# Patient Record
Sex: Female | Born: 1960 | Race: White | Hispanic: No | Marital: Married | Smoking: Never smoker
Health system: Southern US, Community
[De-identification: ages and names within clinical notes are randomized; demographics above are authoritative.]

## PROBLEM LIST (undated history)

## (undated) DIAGNOSIS — Z8719 Personal history of other diseases of the digestive system: Secondary | ICD-10-CM

## (undated) DIAGNOSIS — N200 Calculus of kidney: Secondary | ICD-10-CM

## (undated) DIAGNOSIS — E079 Disorder of thyroid, unspecified: Secondary | ICD-10-CM

## (undated) DIAGNOSIS — K219 Gastro-esophageal reflux disease without esophagitis: Secondary | ICD-10-CM

## (undated) HISTORY — DX: Disorder of thyroid, unspecified: E07.9

## (undated) HISTORY — DX: Calculus of kidney: N20.0

## (undated) HISTORY — DX: Gastro-esophageal reflux disease without esophagitis: K21.9

## (undated) HISTORY — DX: Personal history of other diseases of the digestive system: Z87.19

---

## 1998-04-22 HISTORY — PX: ESOPHAGOGASTRODUODENOSCOPY: SHX1529

## 2004-07-10 ENCOUNTER — Ambulatory Visit: Payer: Self-pay | Admitting: Obstetrics and Gynecology

## 2006-03-12 ENCOUNTER — Ambulatory Visit: Payer: Self-pay | Admitting: Internal Medicine

## 2006-03-31 ENCOUNTER — Ambulatory Visit: Payer: Self-pay | Admitting: Internal Medicine

## 2006-04-29 ENCOUNTER — Ambulatory Visit: Payer: Self-pay | Admitting: Internal Medicine

## 2006-06-11 ENCOUNTER — Ambulatory Visit: Payer: Self-pay | Admitting: Obstetrics and Gynecology

## 2006-11-03 ENCOUNTER — Ambulatory Visit: Payer: Self-pay | Admitting: Internal Medicine

## 2006-12-02 ENCOUNTER — Ambulatory Visit: Payer: Self-pay | Admitting: Internal Medicine

## 2006-12-02 LAB — CONVERTED CEMR LAB
BUN: 8 mg/dL (ref 6–23)
Basophils Relative: 3.8 % — ABNORMAL HIGH (ref 0.0–1.0)
CO2: 27 meq/L (ref 19–32)
Calcium: 8.7 mg/dL (ref 8.4–10.5)
Creatinine, Ser: 0.6 mg/dL (ref 0.4–1.2)
GFR calc Af Amer: 140 mL/min
H Pylori IgG: NEGATIVE
HDL: 56 mg/dL (ref >39.0)
LDL Cholesterol: 101 mg/dL — ABNORMAL HIGH (ref 0–99)
Monocytes Relative: 6.7 % (ref 3.0–11.0)
Platelets: 259 10*3/uL (ref 150–400)
RDW: 16.3 % — ABNORMAL HIGH (ref 11.5–14.6)
TSH: 1.97 microintl units/mL (ref 0.35–5.50)
Total CHOL/HDL Ratio: 2.9
Triglycerides: 31 mg/dL (ref 0–149)
VLDL: 6 mg/dL (ref 0–40)

## 2006-12-16 ENCOUNTER — Ambulatory Visit: Payer: Self-pay | Admitting: Gastroenterology

## 2007-01-04 ENCOUNTER — Ambulatory Visit: Payer: Self-pay | Admitting: Gastroenterology

## 2007-06-21 ENCOUNTER — Ambulatory Visit: Payer: Self-pay | Admitting: Obstetrics and Gynecology

## 2008-07-19 ENCOUNTER — Ambulatory Visit: Payer: Self-pay | Admitting: Obstetrics and Gynecology

## 2008-09-12 ENCOUNTER — Ambulatory Visit: Payer: Self-pay | Admitting: Family Medicine

## 2009-05-23 ENCOUNTER — Ambulatory Visit: Payer: Self-pay | Admitting: Family Medicine

## 2009-05-23 DIAGNOSIS — R002 Palpitations: Secondary | ICD-10-CM | POA: Insufficient documentation

## 2009-05-23 DIAGNOSIS — R030 Elevated blood-pressure reading, without diagnosis of hypertension: Secondary | ICD-10-CM

## 2010-01-17 ENCOUNTER — Ambulatory Visit: Payer: Self-pay | Admitting: Internal Medicine

## 2010-01-17 DIAGNOSIS — K219 Gastro-esophageal reflux disease without esophagitis: Secondary | ICD-10-CM

## 2010-01-17 DIAGNOSIS — L299 Pruritus, unspecified: Secondary | ICD-10-CM | POA: Insufficient documentation

## 2010-01-21 LAB — CONVERTED CEMR LAB
ALT: 10 units/L (ref 0–35)
AST: 20 units/L (ref 0–37)
Alkaline Phosphatase: 73 units/L (ref 39–117)
Basophils Absolute: 0 10*3/uL (ref 0.0–0.1)
Bilirubin, Direct: 0.1 mg/dL (ref 0.0–0.3)
CO2: 29 meq/L (ref 19–32)
Creatinine, Ser: 0.7 mg/dL (ref 0.4–1.2)
Eosinophils Relative: 1.2 % (ref 0.0–5.0)
GFR calc non Af Amer: 95.59 mL/min (ref >60)
HCT: 34 % — ABNORMAL LOW (ref 36.0–46.0)
Lymphocytes Relative: 15.9 % (ref 12.0–46.0)
Lymphs Abs: 1.1 10*3/uL (ref 0.7–4.0)
Monocytes Relative: 6.8 % (ref 3.0–12.0)
Neutrophils Relative %: 75.7 % (ref 43.0–77.0)
Phosphorus: 3.7 mg/dL (ref 2.3–4.6)
Platelets: 239 10*3/uL (ref 150.0–400.0)
Sodium: 141 meq/L (ref 135–145)
Total Bilirubin: 0.3 mg/dL (ref 0.3–1.2)
WBC: 6.9 10*3/uL (ref 4.5–10.5)

## 2010-02-21 ENCOUNTER — Telehealth: Payer: Self-pay | Admitting: Family Medicine

## 2010-02-21 ENCOUNTER — Emergency Department: Payer: Self-pay | Admitting: Emergency Medicine

## 2010-04-05 ENCOUNTER — Ambulatory Visit: Payer: Self-pay | Admitting: Family Medicine

## 2010-04-05 DIAGNOSIS — D649 Anemia, unspecified: Secondary | ICD-10-CM

## 2010-04-05 DIAGNOSIS — R0602 Shortness of breath: Secondary | ICD-10-CM

## 2010-04-05 DIAGNOSIS — R1013 Epigastric pain: Secondary | ICD-10-CM

## 2010-04-09 LAB — CONVERTED CEMR LAB
Basophils Absolute: 0 10*3/uL (ref 0.0–0.1)
Basophils Relative: 0.4 % (ref 0.0–3.0)
Eosinophils Absolute: 0 10*3/uL (ref 0.0–0.7)
HCT: 34 % — ABNORMAL LOW (ref 36.0–46.0)
Hemoglobin: 11.6 g/dL — ABNORMAL LOW (ref 12.0–15.0)
Lymphs Abs: 0.9 10*3/uL (ref 0.7–4.0)
MCHC: 34.2 g/dL (ref 30.0–36.0)
MCV: 90.9 fL (ref 78.0–100.0)
Neutro Abs: 5.1 10*3/uL (ref 1.4–7.7)
RBC: 3.74 M/uL — ABNORMAL LOW (ref 3.87–5.11)
RDW: 14.2 % (ref 11.5–14.6)

## 2010-04-24 ENCOUNTER — Encounter: Payer: Self-pay | Admitting: Internal Medicine

## 2010-04-25 ENCOUNTER — Ambulatory Visit: Payer: Self-pay | Admitting: Gastroenterology

## 2010-05-20 ENCOUNTER — Ambulatory Visit: Payer: Self-pay | Admitting: Internal Medicine

## 2010-05-20 DIAGNOSIS — IMO0001 Reserved for inherently not codable concepts without codable children: Secondary | ICD-10-CM

## 2010-05-22 ENCOUNTER — Ambulatory Visit: Payer: Self-pay | Admitting: Internal Medicine

## 2010-05-24 LAB — CONVERTED CEMR LAB
Albumin: 4.1 g/dL (ref 3.5–5.2)
BUN: 11 mg/dL (ref 6–23)
Calcium: 9.1 mg/dL (ref 8.4–10.5)
Glucose, Bld: 95 mg/dL (ref 70–99)
Phosphorus: 3.1 mg/dL (ref 2.3–4.6)
Potassium: 4.6 meq/L (ref 3.5–5.1)
Sed Rate: 10 mm/hr (ref 0–22)

## 2010-05-28 ENCOUNTER — Telehealth: Payer: Self-pay | Admitting: Internal Medicine

## 2010-05-31 ENCOUNTER — Ambulatory Visit: Payer: Self-pay | Admitting: Internal Medicine

## 2010-05-31 DIAGNOSIS — R4789 Other speech disturbances: Secondary | ICD-10-CM | POA: Insufficient documentation

## 2010-06-03 ENCOUNTER — Ambulatory Visit: Payer: Self-pay | Admitting: Otolaryngology

## 2010-06-03 ENCOUNTER — Encounter: Payer: Self-pay | Admitting: Internal Medicine

## 2010-10-23 ENCOUNTER — Ambulatory Visit: Payer: Self-pay | Admitting: Obstetrics and Gynecology

## 2010-10-23 NOTE — Progress Notes (Signed)
Summary: Trouble with speech  Phone Note Call from Patient Call back at Home Phone [phone]   Caller: Patient Call For: Cindee Salt MD Summary of Call: Patient called for her lab results. Results given to patient and informed patient that they were also put on phone tree for her.  Patient is concerned about her muscle cramps.  Patient is concerned about her speech because she is having trouble with her speech at times.  Patient is aware that Dr. Alphonsus Sias is out of the office this afternoon. Initial call taken by: Sydell Axon LPN,  May 28, 2010 4:48 PM  Follow-up for Phone Call        message left Cindee Salt MD  May 29, 2010 8:57 AM  Left message again Probably in classrooom Please try her later this afternoon to get more specific information Can offer Friday afternoon visit I can try to call late tomorrow afternoon if that is more appropriate Cindee Salt MD  May 29, 2010 1:58 PM   left message on machine at home for patient to return my call.  DeShannon Smith CMA Duncan Dull)  May 29, 2010 4:07 PM   left message on machine at home for patient to return my call.  DeShannon Katrinka Blazing CMA Duncan Dull)  May 30, 2010 5:37 PM   Additional Follow-up for Phone Call Additional follow up Details #1::        Appt scheduled for today at 3:45. Additional Follow-up by: Melody Comas,  May 31, 2010 2:06 PM

## 2010-10-23 NOTE — Assessment & Plan Note (Signed)
Summary: problems w/ speech/alc   Vital Signs:  Patient profile:   50 year old female Weight:      147 pounds Temp:     98.1 degrees F oral BP sitting:   138 / 70  (left arm) Cuff size:   regular  Vitals Entered By: Mervin Hack CMA Duncan Dull) (May 31, 2010 4:00 PM) CC: PROBLEMS WITH SPEECH   History of Present Illness: For the past week, she feels she is messing up words Mixes up consonants--switching them Has been happening multiple times daily  Has just started new job teaching Does have some stress but not overly so  Has to make special effort to try to speak properly clearly abnormal for her No special problem with word finding  Did have upper GI test due to dysfunction of epiglottis? did show some reflux  No swallowing problems Muscle pain and tingling is better No focal weakness  Allergies: No Known Drug Allergies  Past History:  Past medical, surgical, family and social histories (including risk factors) reviewed for relevance to current acute and chronic problems.  Past Medical History: Reviewed history from 01/17/2010 and no changes required. GERD  Past Surgical History: Reviewed history from 01/17/2010 and no changes required. VD x 4 8/99   EGD-normal 4/08 EGD--reflux esophagitis, few angioectasias  Family History: Reviewed history from 01/17/2010 and no changes required. Mom died from leukemia @75  Dad alive 7 sibs No CAD, DM, HTN  Social History: Reviewed history from 01/17/2010 and no changes required. Occupation:  [employer] Married--2nd 4 children Never Smoked Alcohol use-no  Review of Systems       Sleeping fair--does have night awakening and may have trouble getting back to sleep feels like she is perimenopausal did have 2 MRIs in past 16 years due to various concerns  Physical Exam  General:  alert and normal appearance.   Eyes:  pupils equal, pupils round, pupils reactive to light, and no optic disk  abnormalities.   Mouth:  no erythema and no lesions.   Neck:  supple and no masses.   Neurologic:  alert & oriented X3, cranial nerves II-XII intact, strength normal in all extremities, gait normal, finger-to-nose normal, heel-to-shin normal, and Romberg negative.   Psych:  normally interactive, good eye contact, not depressed appearing, and slightly anxious.     Impression & Recommendations:  Problem # 1:  OTHER SPEECH DISTURBANCE (ICD-784.59) Assessment New mild but definitely not normal could be related to stress but need to consider CNS etiology If something there, would probably be in left cortex in language area (as she is right handed) The epiglottis issues she has would not be cortical but probably brain stem  ENT has already ordered MRI with contrast this is appropriate to exclude cerebrovascular disease (unlikely) or MS (very unlikely)  Complete Medication List: 1)  Prilosec 40 Mg Cpdr (Omeprazole) .Marland Kitchen.. 1 tab by mouth daily 2)  Metoprolol Succinate 100 Mg Xr24h-tab (Metoprolol succinate) .... Take 1/2 tab daily  Patient Instructions: 1)  Please keep regular follow up  Current Allergies (reviewed today): No known allergies

## 2010-10-23 NOTE — Letter (Signed)
Summary: [facility]  [facility]   Imported By: Lanelle Bal 07/17/2010 13:37:22  _____________________________________________________________________  External Attachment:    Type:   Image     Comment:   External Document  Appended Document: [facility] GERD with globus sensation improved on PPI had barium swallow done

## 2010-10-23 NOTE — Progress Notes (Signed)
Summary: Palpitations  Phone Note Call from Patient Call back at Home Phone [phone]   Caller: Patient Call For: Cindee Salt MD Summary of Call: Patient says she has had palpitations in the past and had an EKG and was told that things were okay.  She has had more palpitations in the past few months but yesterday and today have been pretty frequent and the past 2 hours has been almost constant to the point that she is getting anxious.  No appointments available with anyone.  Please advise. Initial call taken by: Delilah Shan CMA (AAMA),  February 21, 2010 12:58 PM  Follow-up for Phone Call        If that bad, to ER to be seen. Follow-up by: Shaune Leeks MD,  February 21, 2010 1:35 PM  Additional Follow-up for Phone Call Additional follow up Details #1::        Left message on voicemail  in detail.  Personalized VM.   Lugene Fuquay CMA (AAMA)  February 21, 2010 2:06 PM   Pt called to report that she did go to ER yesterday and will be seeing a cardiologit. Additional Follow-up by: Lowella Petties CMA,  February 22, 2010 9:43 AM    Additional Follow-up for Phone Call Additional follow up Details #2::    Noted. Follow-up by: Shaune Leeks MD,  February 22, 2010 1:54 PM

## 2010-10-23 NOTE — Assessment & Plan Note (Signed)
Summary: itching and talk about blood work/dlo   Vital Signs:  Patient profile:   50 year old female Weight:      146 pounds Temp:     98.2 degrees F oral Pulse rate:   92 / minute Pulse rhythm:   regular BP sitting:   150 / 80  (left arm) Cuff size:   regular  Vitals Entered By: Mervin Hack CMA Duncan Dull) (January 17, 2010 4:23 PM) CC: itching all over   History of Present Illness: Sees gynecologist every year  Has had some itching for the past month prominently---may have started mildly before that No dry skin doesn't seem to be pollen related no rash Tried benedryl--has helped at bedtime some May get rash after itching  Does tend to think about things---read about how itching can be due to blood cancers No generalized anxiety problems Occ pinprick type feeling  Varies soap and detergent but doesn't seem related On iron but no change in that for a couple of years  Preventive Screening-Counseling & Management  Alcohol-Tobacco     Smoking Status: never  Allergies: No Known Drug Allergies  Past History:  Past Medical History: GERD  Past Surgical History: VD x 4 8/99   EGD-normal 4/08 EGD--reflux esophagitis, few angioectasias  Family History: Mom died from leukemia @75  Dad alive 7 sibs No CAD, DM, HTN  Social History: Occupation:  [employer] Married--2nd 4 children Never Smoked Alcohol use-no Occupation:  employed Smoking Status:  never  Review of Systems       weight stable sleeps okay--occ night awakening Occ depressed mood---generally resolves within 1-2 days  Physical Exam  General:  alert and normal appearance.   Mouth:  no erythema and no exudates.   Neck:  supple, no masses, no thyromegaly, no carotid bruits, and no cervical lymphadenopathy.   Lungs:  normal respiratory effort and normal breath sounds.   Heart:  normal rate, regular rhythm, no murmur, and no gallop.   Abdomen:  soft, non-tender, no hepatomegaly, and  no splenomegaly.   Msk:  no joint tenderness and no joint swelling.   Pulses:  normal in feet Extremities:  no edema Axillary Nodes:  No palpable lymphadenopathy Inguinal Nodes:  No significant adenopathy Psych:  normally interactive, good eye contact, not anxious appearing, and not depressed appearing.     Impression & Recommendations:  Problem # 1:  PRURITUS (ICD-698.9) Assessment New  sounds like she may have some dermatographism also no true hives no rash  discussed moisturizing soaps and cream after shower benedryl at night will check labs  Orders: Venipuncture (16109) TLB-Renal Function Panel (80069-RENAL) TLB-CBC Platelet - w/Differential (85025-CBCD) TLB-Hepatic/Liver Function Pnl (80076-HEPATIC) TLB-TSH (Thyroid Stimulating Hormone) (84443-TSH) TLB-B12, Serum-Total ONLY (60454-U98) TLB-Sedimentation Rate (ESR) (85652-ESR)  Complete Medication List: 1)  Hydroxyzine Pamoate 25 Mg Caps (Hydroxyzine pamoate) .Marland Kitchen.. 1 tab by mouth three times a day as needed for itching  Patient Instructions: 1)  Please set up a physical at your convenience Prescriptions: HYDROXYZINE PAMOATE 25 MG CAPS (HYDROXYZINE PAMOATE) 1 tab by mouth three times a day as needed for itching  #90 x 1   Entered and Authorized by:   Cindee Salt MD   Signed by:   Cindee Salt MD on 01/17/2010   Method used:   Electronically to        [facility] (retail)       [address]       [place]  [place], [place]  [address]       Ph: [phone]       Fax: [phone]   RxID:   [id number]   Prior Medications: Current Allergies (reviewed today): No known allergies

## 2010-10-23 NOTE — Assessment & Plan Note (Signed)
Summary: BODY ACHES & CRAMPING/CLE   Vital Signs:  Patient profile:   50 year old female Weight:      148 pounds Pulse rate:   88 / minute Pulse rhythm:   regular BP sitting:   132 / 70  (left arm) Cuff size:   regular  Vitals Entered By: Mervin Hack CMA Duncan Dull) (May 20, 2010 5:03 PM) CC: muscle pain, body aches and all over body cramping   History of Present Illness: Having a lot of muscle cramps over the past 2 weeks Occ just feels it in her arms, occ all over Feels "knots" in her arms--makes her on edge tries to rub it to relieve them but not helpful  More at night but during the day at times also Last night was bad Occ shooting pains and tingling  started after moving things at work all day  No fever No recent illness Has had mosquito bites regularly  Appetite is okay no weight loss  Home BP 90-100 systolic and pulse  ~70  Allergies: No Known Drug Allergies  Past History:  Past medical, surgical, family and social histories (including risk factors) reviewed for relevance to current acute and chronic problems.  Past Medical History: Reviewed history from 01/17/2010 and no changes required. GERD  Past Surgical History: Reviewed history from 01/17/2010 and no changes required. VD x 4 8/99   EGD-normal 4/08 EGD--reflux esophagitis, few angioectasias  Family History: Reviewed history from 01/17/2010 and no changes required. Mom died from leukemia @75  Dad alive 7 sibs No CAD, DM, HTN  Social History: Reviewed history from 01/17/2010 and no changes required. Occupation:  [employer] Married--2nd 4 children Never Smoked Alcohol use-no  Review of Systems       Palpitations in June---wound up seeing cardiologist. Started on metoprolol for her pulse being up She feels this may be a nerve thing with going to the doctor has been on 200mg  but now decreased to 100 (did help the palpitations) Stomach trouble while on the  metoprolol---now better omeprazole with some help. Nexium didn't help Has had barium swallow done and then saw ENT  Physical Exam  General:  alert and normal appearance.   Neck:  supple, no masses, no thyromegaly, and no cervical lymphadenopathy.   Lungs:  normal respiratory effort, no intercostal retractions, no accessory muscle use, and normal breath sounds.   Heart:  normal rate, regular rhythm, no murmur, and no gallop.  HR  ~90 Msk:  no joint tenderness and no joint swelling.   No muscle tenderness Neurologic:  alert & oriented X3, strength normal in all extremities, and gait normal.   Psych:  normally interactive, good eye contact, not depressed appearing, and slightly anxious.     Impression & Recommendations:  Problem # 1:  MYALGIA (ICD-729.1) Assessment New not clear cut situation mostly cramps but some pain will try to wean the metoprolol check labs increase fluids  Complete Medication List: 1)  Prilosec 40 Mg Cpdr (Omeprazole) .Marland Kitchen.. 1 tab by mouth daily 2)  Metoprolol Succinate 100 Mg Xr24h-tab (Metoprolol succinate) .... Take 1/2 tab daily  Patient Instructions: 1)  Please cut back on the metoprolol to 50mg  daily 2)  Increase your fluid intake 3)  Please return for labwork  -- renal, CPK-total, ESR (sed rate)--729.1 4)  Please call for reevaluation in 2-3 weeks if the muscle cramps and pain are not better  Current Allergies (reviewed today): No known allergies

## 2010-10-23 NOTE — Assessment & Plan Note (Signed)
Summary: SHORTNESS OF BREATH X 2 WEEKS   Vital Signs:  Patient profile:   50 year old female Height:      68.5 inches Weight:      147.38 pounds BMI:     22.16 O2 Sat:      94 % on Room air Temp:     97.7 degrees F oral Pulse rate:   84 / minute Pulse rhythm:   regular BP sitting:   110 / 70  (left arm) Cuff size:   regular  Vitals Entered By: Linde Gillis CMA Duncan Dull) (April 05, 2010 12:10 PM)  O2 Flow:  Room air CC: shortness of breath   CC:  shortness of breath.  History of Present Illness: 50 yo new to me here for SOB.  Symptoms have occurred for over five years. No exercise intolerance of DOE.  No CP. Often has a dry cough and feels short of breath. Often associated with epigastric pain. Has had more gas, burping. No n/v/d.  No changes in bowels. No fevers. No wheezing. No LE or fatigue.  Has appt with [facility]) next month for IBS.  Current Medications (verified): 1)  Metoprolol Succinate 200 Mg Xr24h-Tab (Metoprolol Succinate) .... Take One Tablet By Mouth Once Daily 2)  Prilosec 40 Mg Cpdr (Omeprazole) .Marland Kitchen.. 1 Tab By Mouth Daily  Allergies (verified): No Known Drug Allergies  Past History:  Past Medical History: Last updated: 2010-01-19 GERD  Past Surgical History: Last updated: January 19, 2010 VD x 4 8/99   EGD-normal 4/08 EGD--reflux esophagitis, few angioectasias  Family History: Last updated: 01-19-2010 Mom died from leukemia @75  Dad alive 7 sibs No CAD, DM, HTN  Social History: Last updated: 2010-01-19 Occupation:  Teacher--elementary school Married--2nd 4 children Never Smoked Alcohol use-no  Risk Factors: Smoking Status: never (01/19/2010)  Review of Systems      See HPI General:  Denies fever. Resp:  Complains of cough and shortness of breath; denies sputum productive and wheezing. GI:  Complains of abdominal pain, gas, and indigestion; denies change in bowel habits, diarrhea, loss of appetite, nausea, and  vomiting.  Physical Exam  General:  alert and normal appearance.   VSS, O2 sat 94%. Lungs:  normal respiratory effort and normal breath sounds.   Heart:  normal rate, regular rhythm, no murmur, and no gallop.   Abdomen:  soft, non-tender, no hepatomegaly, and no splenomegaly.   Psych:  normally interactive, good eye contact, not anxious appearing, and not depressed appearing.     Impression & Recommendations:  Problem # 1:  SHORTNESS OF BREATH (ICD-786.05) Assessment New Long standing and associated with reflux type symptoms. Likely due to reflux, lungs clear, exercise tolerance not affected and O2 sats normal--unlikely a pulmonary etiology. Will check Lipase although does not have associated symptoms of pancreatitis. Try omeprazole 40 mg daily x 2 weeks. Keep appt with GI.  Complete Medication List: 1)  Metoprolol Succinate 200 Mg Xr24h-tab (Metoprolol succinate) .... Take one tablet by mouth once daily 2)  Prilosec 40 Mg Cpdr (Omeprazole) .Marland Kitchen.. 1 tab by mouth daily  Other Orders: Venipuncture (16109) TLB-CBC Platelet - w/Differential (85025-CBCD) TLB-Lipase (83690-LIPASE)  Current Allergies (reviewed today): No known allergies

## 2010-12-02 ENCOUNTER — Ambulatory Visit: Payer: Self-pay | Admitting: Family Medicine

## 2011-12-15 ENCOUNTER — Encounter: Payer: Self-pay | Admitting: Internal Medicine

## 2011-12-15 ENCOUNTER — Ambulatory Visit (INDEPENDENT_AMBULATORY_CARE_PROVIDER_SITE_OTHER): Payer: BC Managed Care – PPO | Admitting: Internal Medicine

## 2011-12-15 VITALS — BP 130/68 | HR 122 | Temp 98.6°F | Wt 146.0 lb

## 2011-12-15 DIAGNOSIS — J069 Acute upper respiratory infection, unspecified: Secondary | ICD-10-CM | POA: Insufficient documentation

## 2011-12-15 MED ORDER — AMOXICILLIN 500 MG PO TABS: 1000.0000 mg | ORAL_TABLET | Freq: Two times a day (BID) | ORAL | Status: AC

## 2011-12-15 NOTE — Progress Notes (Signed)
  Subjective:    Patient ID: Jenny Rangel, female    DOB: June 29, 1961, 51 y.o.   MRN: [id number]  HPI Has had a cold that started with laryngitis last week Seemed to improve and then worsened again Nighttime sore throat----"throbbing" sensation Congestion in head and sinus Some headache Chills without clear fever  Some cough--then breaks up after morning No SOB Occ referred right ear pain from throat  Tried tylenol and gargling with salt water---?some help  No current outpatient prescriptions on file prior to visit.    No Known Allergies  Past Medical History  Diagnosis Date  . GERD (gastroesophageal reflux disease)     Past Surgical History  Procedure Date  . Vaginal delivery     x4  . Esophagogastroduodenoscopy 08/99    normal    Family History  Problem Relation Age of Onset  . Diabetes Neg Hx   . Heart disease Neg Hx   . Hypertension Neg Hx     History   Social History  . Marital Status: Married    Spouse Name: N/A    Number of Children: 4  . Years of Education: N/A   Occupational History  . teacher, elementary school    Social History Main Topics  . Smoking status: Never Smoker   . Smokeless tobacco: Never Used  . Alcohol Use: No  . Drug Use: No  . Sexually Active: Not on file   Other Topics Concern  . Not on file   Social History Narrative  . No narrative on file     Review of Systems No vomiting or diarrhea Appetite down some    Objective:   Physical Exam  Constitutional: She appears well-developed and well-nourished. No distress.  HENT:       No sinus tenderness TMs normal Mild nasal inflammation Mild pharyngeal injection  Neck: Normal range of motion. Neck supple. No thyromegaly present.  Pulmonary/Chest: Effort normal and breath sounds normal. No respiratory distress. She has no wheezes. She has no rales.  Lymphadenopathy:    She has no cervical adenopathy.          Assessment & Plan:

## 2011-12-15 NOTE — Assessment & Plan Note (Signed)
Started with laryngitis and now some sinus symptoms Discussed supportive Rx Amoxil if she worsens---esp cough and increased sinus drainage

## 2012-03-12 ENCOUNTER — Encounter: Payer: Self-pay | Admitting: *Deleted

## 2012-03-12 ENCOUNTER — Encounter: Payer: Self-pay | Admitting: Internal Medicine

## 2012-03-12 ENCOUNTER — Ambulatory Visit (INDEPENDENT_AMBULATORY_CARE_PROVIDER_SITE_OTHER): Payer: BC Managed Care – PPO | Admitting: Internal Medicine

## 2012-03-12 VITALS — BP 130/70 | HR 98 | Temp 97.5°F | Wt 144.0 lb

## 2012-03-12 DIAGNOSIS — R42 Dizziness and giddiness: Secondary | ICD-10-CM

## 2012-03-12 DIAGNOSIS — R7989 Other specified abnormal findings of blood chemistry: Secondary | ICD-10-CM

## 2012-03-12 DIAGNOSIS — R946 Abnormal results of thyroid function studies: Secondary | ICD-10-CM

## 2012-03-12 LAB — CBC WITH DIFFERENTIAL/PLATELET
Basophils Relative: 0.4 % (ref 0.0–3.0)
Eosinophils Absolute: 0 10*3/uL (ref 0.0–0.7)
HCT: 34.9 % — ABNORMAL LOW (ref 36.0–46.0)
Hemoglobin: 11.4 g/dL — ABNORMAL LOW (ref 12.0–15.0)
Lymphs Abs: 0.5 10*3/uL — ABNORMAL LOW (ref 0.7–4.0)
MCHC: 32.7 g/dL (ref 30.0–36.0)
MCV: 89.2 fl (ref 78.0–100.0)
Monocytes Absolute: 0.2 10*3/uL (ref 0.1–1.0)
Neutro Abs: 2.9 10*3/uL (ref 1.4–7.7)
RBC: 3.91 Mil/uL (ref 3.87–5.11)

## 2012-03-12 LAB — T4, FREE: Free T4: 0.74 ng/dL (ref 0.60–1.60)

## 2012-03-12 LAB — BASIC METABOLIC PANEL
CO2: 25 mEq/L (ref 19–32)
Chloride: 108 mEq/L (ref 96–112)
Sodium: 140 mEq/L (ref 135–145)

## 2012-03-12 LAB — HEPATIC FUNCTION PANEL
ALT: 7 U/L (ref 0–35)
Bilirubin, Direct: 0.1 mg/dL (ref 0.0–0.3)
Total Protein: 7 g/dL (ref 6.0–8.3)

## 2012-03-12 MED ORDER — MECLIZINE HCL 25 MG PO TABS: 25.0000 mg | ORAL_TABLET | Freq: Three times a day (TID) | ORAL | Status: AC | PRN

## 2012-03-12 NOTE — Progress Notes (Signed)
  Subjective:    Patient ID: Jenny Rangel, female    DOB: [birth year], 51 y.o.   MRN: [id number]  HPI Started with "very lightheaded feeling---like my head is spinning" Started after taking benedryl for itching 3 days ago Groggy the next morning---then the lightheadedness  Has had dizziness in the past Neuro evaluations, MRI, etc This is similar----sensation in occiput and some degree of headache No true rotatory vertigo More like if she drank too much No major balance issues  Heating pad did help occipital pain but also on vertex  Tried some tylenol and ibuprofen---not clear if they helped at all  Diagnosed with mild underactive thyroid Chose not to have Rx Gyn did blood work about 2 months ago  No current outpatient prescriptions on file prior to visit.    No Known Allergies  Past Medical History  Diagnosis Date  . GERD (gastroesophageal reflux disease)     Past Surgical History  Procedure Date  . Vaginal delivery     x4  . Esophagogastroduodenoscopy 08/99    normal    Family History  Problem Relation Age of Onset  . Diabetes Neg Hx   . Heart disease Neg Hx   . Hypertension Neg Hx     History   Social History  . Marital Status: Married    Spouse Name: N/A    Number of Children: 4  . Years of Education: N/A   Occupational History  . teacher, elementary school    Social History Main Topics  . Smoking status: Never Smoker   . Smokeless tobacco: Never Used  . Alcohol Use: No  . Drug Use: No  . Sexually Active: Not on file   Other Topics Concern  . Not on file   Social History Narrative  . No narrative on file   Review of Systems No diplopia or unilateral vision loss No jaw claudication No speech or swallowing problems No face or extremity weakness    Objective:   Physical Exam  Constitutional: She appears well-developed and well-nourished. No distress.  HENT:  Head: Normocephalic and atraumatic.  Mouth/Throat: Oropharynx is clear and  moist. No oropharyngeal exudate.  Eyes: Conjunctivae and EOM are normal. Pupils are equal, round, and reactive to light.       Fundi and discs are normal  Neck: Normal range of motion. Neck supple. No thyromegaly present.       No muscle tension   Lymphadenopathy:    She has no cervical adenopathy.  Neurological: She is alert. She has normal strength. She displays no atrophy and no tremor. No cranial nerve deficit or sensory deficit. She exhibits normal muscle tone. She displays a negative Romberg sign. Coordination and gait normal.       Finger to nose normal          Assessment & Plan:

## 2012-03-12 NOTE — Assessment & Plan Note (Signed)
Hypothyroidism could cause some of these symptoms Will recheck labs

## 2012-03-12 NOTE — Assessment & Plan Note (Signed)
Vague but has been chronic Now with abnormal head pressure also No worrisome neuro findings ?vestibular Will try meclizine

## 2012-03-17 ENCOUNTER — Telehealth: Payer: Self-pay

## 2012-03-17 NOTE — Telephone Encounter (Signed)
Pt received copy of lab results and wants more detailed info re: CBC and thyroid test. Pt's mother had leukemia; pt concerned about WBC. Pt request call back from Dr Alphonsus Sias at [phone].Please advise.

## 2012-03-18 NOTE — Telephone Encounter (Signed)
Discussed results with patient WBC mildly low but elevated neutrophils so ANC normal May have had infection (viral)  Will observe only If she is not feeling normal in 1-2 months, we will recheck

## 2012-04-29 ENCOUNTER — Encounter: Payer: Self-pay | Admitting: Internal Medicine

## 2012-04-29 ENCOUNTER — Ambulatory Visit (INDEPENDENT_AMBULATORY_CARE_PROVIDER_SITE_OTHER): Payer: BC Managed Care – PPO | Admitting: Internal Medicine

## 2012-04-29 VITALS — BP 140/70 | HR 115 | Temp 98.5°F | Wt 147.0 lb

## 2012-04-29 DIAGNOSIS — D72819 Decreased white blood cell count, unspecified: Secondary | ICD-10-CM

## 2012-04-29 DIAGNOSIS — J029 Acute pharyngitis, unspecified: Secondary | ICD-10-CM

## 2012-04-29 NOTE — Assessment & Plan Note (Signed)
Was borderline low but she is concerned Will recheck when hopefully her symptoms have resolved

## 2012-04-29 NOTE — Patient Instructions (Addendum)
Please set up blood work in about 1 month (CBC with diff for 288.50)

## 2012-04-29 NOTE — Progress Notes (Signed)
  Subjective:    Patient ID: Jenny Rangel, female    DOB: Jun 26, 1961, 51 y.o.   MRN: [id number]  HPI Dizziness did resolve after a few weeks  Now has had at least 2 weeks of feeling tired--may be some better now Having neck pain in occiput when she turns her head--occ feels it in glands in front of neck "Sick" headache Some intermittent throat problems---may have drainage and thinks it is affecting her throat Tea helps the irritation (not like strep pain and no odynophagia) Slight cough---seems like something is breaking up---thinks it may be related to drainage  No fever Gets some sweats ---relates to perimenopause No SOB  Has tried tylenol Occ occipital headache---thought that seemed stressful  No current outpatient prescriptions on file prior to visit.    No Known Allergies  Past Medical History  Diagnosis Date  . GERD (gastroesophageal reflux disease)     Past Surgical History  Procedure Date  . Vaginal delivery     x4  . Esophagogastroduodenoscopy 08/99    normal    Family History  Problem Relation Age of Onset  . Diabetes Neg Hx   . Heart disease Neg Hx   . Hypertension Neg Hx     History   Social History  . Marital Status: Married    Spouse Name: N/A    Number of Children: 4  . Years of Education: N/A   Occupational History  . teacher, elementary school    Social History Main Topics  . Smoking status: Never Smoker   . Smokeless tobacco: Never Used  . Alcohol Use: No  . Drug Use: No  . Sexually Active: Not on file   Other Topics Concern  . Not on file   Social History Narrative  . 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 No distress.  HENT:  Head: Normocephalic and atraumatic.  Right Ear: External ear normal.  Left Ear: External ear normal.  Nose: Nose normal.    Mouth/Throat: No oropharyngeal exudate.       TMs normal Slight pharyngeal injection  Neck: Normal range of motion. Neck supple. No thyromegaly present.       No salivary glands or lymph gland enlargement  Cardiovascular: Normal rate, regular rhythm and normal heart sounds.  Exam reveals no gallop.   No murmur heard. Pulmonary/Chest: Effort normal and breath sounds normal. No respiratory distress. She has no wheezes. She has no rales.  Abdominal: Soft. There is no tenderness.  Musculoskeletal: She exhibits no edema and no tenderness.  Lymphadenopathy:    She has no cervical adenopathy.  Skin: No rash noted. No erythema.  Psychiatric: She has a normal mood and affect. Her behavior is normal.          Assessment & Plan:

## 2012-04-29 NOTE — Assessment & Plan Note (Signed)
Has vague sense of not being right Slight pharyngeal injection Neck tightness with rare secondary headache  All seems mostly like viral infection No serious symptoms or PE findings She was concerned due to slightly low WBC count on last blood work  P: reassured for now      Supportive care---like sleeping enough and tylenol     Recheck CBC in a month if feeling well     reeval if new symptoms occur

## 2012-04-30 ENCOUNTER — Ambulatory Visit: Payer: BC Managed Care – PPO | Admitting: Internal Medicine

## 2012-05-28 ENCOUNTER — Other Ambulatory Visit: Payer: BC Managed Care – PPO

## 2012-05-31 ENCOUNTER — Other Ambulatory Visit: Payer: BC Managed Care – PPO

## 2012-05-31 ENCOUNTER — Other Ambulatory Visit (INDEPENDENT_AMBULATORY_CARE_PROVIDER_SITE_OTHER): Payer: BC Managed Care – PPO

## 2012-05-31 DIAGNOSIS — D72819 Decreased white blood cell count, unspecified: Secondary | ICD-10-CM

## 2012-06-01 ENCOUNTER — Encounter: Payer: Self-pay | Admitting: *Deleted

## 2012-06-01 LAB — CBC WITH DIFFERENTIAL/PLATELET
Basophils Relative: 0.9 % (ref 0.0–3.0)
Eosinophils Absolute: 0.1 10*3/uL (ref 0.0–0.7)
Lymphocytes Relative: 32.7 % (ref 12.0–46.0)
MCHC: 32.4 g/dL (ref 30.0–36.0)
Neutrophils Relative %: 56.7 % (ref 43.0–77.0)
RBC: 3.99 Mil/uL (ref 3.87–5.11)
WBC: 6 10*3/uL (ref 4.5–10.5)

## 2012-08-23 ENCOUNTER — Telehealth: Payer: Self-pay

## 2012-08-23 NOTE — Telephone Encounter (Signed)
Pt left v/m requesting 05/2012 lab results RBC sent to Dr Baldomero Lamy at [facility] [phone].Please advise.does pt need to sign record release.

## 2012-08-24 NOTE — Telephone Encounter (Signed)
.  left message to have patient return my call. Results faxed to [facility]

## 2012-09-03 ENCOUNTER — Ambulatory Visit: Payer: Self-pay | Admitting: Gastroenterology

## 2012-09-08 LAB — PATHOLOGY REPORT

## 2012-09-22 DIAGNOSIS — N200 Calculus of kidney: Secondary | ICD-10-CM

## 2012-09-22 HISTORY — DX: Calculus of kidney: N20.0

## 2012-10-07 ENCOUNTER — Ambulatory Visit: Payer: Self-pay | Admitting: Gastroenterology

## 2012-10-07 LAB — CBC WITH DIFFERENTIAL/PLATELET
Basophil %: 0.4 %
Eosinophil #: 0 10*3/uL (ref 0.0–0.7)
Eosinophil %: 0.9 %
HCT: 33.6 % — ABNORMAL LOW (ref 35.0–47.0)
HGB: 11.2 g/dL — ABNORMAL LOW (ref 12.0–16.0)
Lymphocyte #: 0.7 10*3/uL — ABNORMAL LOW (ref 1.0–3.6)
Lymphocyte %: 15 %
MCHC: 33.4 g/dL (ref 32.0–36.0)
MCV: 89 fL (ref 80–100)
Monocyte #: 0.3 x10 3/mm (ref 0.2–0.9)
Monocyte %: 6.9 %
Platelet: 247 10*3/uL (ref 150–440)
RDW: 14 % (ref 11.5–14.5)

## 2012-10-07 LAB — COMPREHENSIVE METABOLIC PANEL
Albumin: 4 g/dL (ref 3.4–5.0)
Alkaline Phosphatase: 111 U/L (ref 50–136)
Anion Gap: 5 — ABNORMAL LOW (ref 7–16)
Bilirubin,Total: 0.3 mg/dL (ref 0.2–1.0)
Creatinine: 0.76 mg/dL (ref 0.60–1.30)
EGFR (African American): >60
EGFR (Non-African Amer.): >60
Osmolality: 276 (ref 275–301)
Potassium: 4.1 mmol/L (ref 3.5–5.1)
SGPT (ALT): 14 U/L (ref 12–78)
Sodium: 139 mmol/L (ref 136–145)
Total Protein: 7.9 g/dL (ref 6.4–8.2)

## 2012-10-07 LAB — LIPID PANEL
Cholesterol: 134 mg/dL (ref 0–200)
HDL Cholesterol: 57 mg/dL (ref 40–60)
Ldl Cholesterol, Calc: 69 mg/dL (ref 0–100)
Triglycerides: 41 mg/dL (ref 0–200)
VLDL Cholesterol, Calc: 8 mg/dL (ref 5–40)

## 2012-10-12 ENCOUNTER — Ambulatory Visit: Payer: Self-pay | Admitting: Gastroenterology

## 2012-11-09 ENCOUNTER — Ambulatory Visit: Payer: Self-pay | Admitting: Obstetrics and Gynecology

## 2013-01-03 ENCOUNTER — Ambulatory Visit (INDEPENDENT_AMBULATORY_CARE_PROVIDER_SITE_OTHER): Payer: BC Managed Care – PPO | Admitting: Internal Medicine

## 2013-01-03 ENCOUNTER — Encounter: Payer: Self-pay | Admitting: Internal Medicine

## 2013-01-03 VITALS — BP 150/80 | HR 129 | Temp 98.7°F | Wt 145.0 lb

## 2013-01-03 DIAGNOSIS — R000 Tachycardia, unspecified: Secondary | ICD-10-CM

## 2013-01-03 NOTE — Patient Instructions (Signed)
Please get records of all tests done by Dr Welton Flakes a few years ago

## 2013-01-03 NOTE — Assessment & Plan Note (Addendum)
Heart rate progressively went down during the time here (close to 130, then down eventually to 102) Always faster and BP higher when she tries to check Has had as low as 114/80, 120/70----when she isn't anxious  Will get Dr Milta Deiters records for the testing she has had Will check labs now Discussed symptomatic Rx with metoprolol again--- we decided to hold off on this

## 2013-01-03 NOTE — Progress Notes (Signed)
  Subjective:    Patient ID: Jenny Rangel, female    DOB: [birth year], 52 y.o.   MRN: [id number]  HPI Took her BP last week---seemed somewhat high Gets very nervous when she does this 140-155/92 At dentist, as high as 160  For the past week, she feels her pulse in her head Can actually feel pressure in nose--doesn't know if it is the same thing as her pulse Can feel her pulse in her legs also Doesn't feel that it is really fast then  Heart does pound a little when she gets nervous-can be fast then No skipped beats No SOB with the pulse in head symptoms  Had cardiac work up about 3 years ago---stress and echo? Started on BP meds for rapid pulse then Did do holter but no abnormality found Took metoprolol for a while and it calmed down the pulse  Continues to exercise Can run 3 miles steadily--- 35 minutes Actually feels improved exercise tolerance  No current outpatient prescriptions on file prior to visit.   No current facility-administered medications on file prior to visit.    No Known Allergies  Past Medical History  Diagnosis Date  . GERD (gastroesophageal reflux disease)     Past Surgical History  Procedure Laterality Date  . Vaginal delivery      x4  . Esophagogastroduodenoscopy  08/99    normal    Family History  Problem Relation Age of Onset  . Diabetes Neg Hx   . Heart disease Neg Hx   . Hypertension Neg Hx     History   Social History  . Marital Status: Married    Spouse Name: N/A    Number of Children: 4  . Years of Education: N/A   Occupational History  . teacher, elementary school    Social History Main Topics  . Smoking status: Never Smoker   . Smokeless tobacco: Never Used  . Alcohol Use: No  . Drug Use: No  . Sexually Active: Not on file   Other Topics Concern  . Not on file   Social History Narrative  . No narrative on file    Review of Systems Sleeps okay No ongoing depression or anxiety Appetite is fine Weight  stable     Objective:   Physical Exam  Constitutional: She appears well-developed and well-nourished. No distress.  HENT:  No temporal or carotid bruits  Neck: Normal range of motion. Neck supple. No thyromegaly present.  Cardiovascular: Regular rhythm and normal heart sounds.  Exam reveals no gallop.   ??very faint systolic flow murmur Heart rate ~120 BP 156/78  Pulmonary/Chest: Effort normal and breath sounds normal. No respiratory distress. She has no wheezes. She has no rales.  Abdominal: Soft. She exhibits no mass. There is no tenderness.  Musculoskeletal: She exhibits no edema and no tenderness.  Lymphadenopathy:    She has no cervical adenopathy.  Psychiatric: She has a normal mood and affect. Her behavior is normal.          Assessment & Plan:

## 2013-01-04 ENCOUNTER — Encounter: Payer: Self-pay | Admitting: *Deleted

## 2013-01-04 LAB — BASIC METABOLIC PANEL
GFR: 97.61 mL/min (ref >60.00)
Glucose, Bld: 110 mg/dL — ABNORMAL HIGH (ref 70–99)
Potassium: 4.8 mEq/L (ref 3.5–5.1)
Sodium: 136 mEq/L (ref 135–145)

## 2013-01-04 LAB — TSH: TSH: 2.15 u[IU]/mL (ref 0.35–5.50)

## 2013-01-04 LAB — CBC WITH DIFFERENTIAL/PLATELET
Basophils Relative: 0.4 % (ref 0.0–3.0)
Eosinophils Relative: 0.7 % (ref 0.0–5.0)
HCT: 35.1 % — ABNORMAL LOW (ref 36.0–46.0)
Hemoglobin: 11.6 g/dL — ABNORMAL LOW (ref 12.0–15.0)
Lymphs Abs: 0.9 10*3/uL (ref 0.7–4.0)
Monocytes Relative: 5.4 % (ref 3.0–12.0)
Neutro Abs: 5.7 10*3/uL (ref 1.4–7.7)
RBC: 4.09 Mil/uL (ref 3.87–5.11)
RDW: 14.4 % (ref 11.5–14.6)
WBC: 7.1 10*3/uL (ref 4.5–10.5)

## 2013-01-04 LAB — HEPATIC FUNCTION PANEL
Alkaline Phosphatase: 81 U/L (ref 39–117)
Bilirubin, Direct: 0.1 mg/dL (ref 0.0–0.3)
Total Bilirubin: 0.5 mg/dL (ref 0.3–1.2)

## 2013-01-26 ENCOUNTER — Encounter: Payer: Self-pay | Admitting: Internal Medicine

## 2013-02-10 ENCOUNTER — Encounter: Payer: Self-pay | Admitting: Internal Medicine

## 2013-02-16 ENCOUNTER — Encounter: Payer: Self-pay | Admitting: Internal Medicine

## 2013-03-19 ENCOUNTER — Encounter: Payer: Self-pay | Admitting: Family Medicine

## 2013-03-19 ENCOUNTER — Ambulatory Visit (INDEPENDENT_AMBULATORY_CARE_PROVIDER_SITE_OTHER): Payer: BC Managed Care – PPO | Admitting: Family Medicine

## 2013-03-19 VITALS — BP 150/84 | Temp 96.8°F | Wt 145.0 lb

## 2013-03-19 DIAGNOSIS — S1093XA Contusion of unspecified part of neck, initial encounter: Secondary | ICD-10-CM

## 2013-03-19 DIAGNOSIS — S0093XA Contusion of unspecified part of head, initial encounter: Secondary | ICD-10-CM

## 2013-03-19 DIAGNOSIS — S0003XA Contusion of scalp, initial encounter: Secondary | ICD-10-CM

## 2013-03-19 MED ORDER — HYDROCODONE-ACETAMINOPHEN 5-325 MG PO TABS: 1.0000 | ORAL_TABLET | ORAL | Status: DC | PRN

## 2013-03-19 NOTE — Progress Notes (Signed)
  Subjective:    Patient ID: Jenny Rangel, female    DOB: Mar 25, 1961, 52 y.o.   MRN: [id number]  HPI Here for a HA since hitting her head yesterday around 5 PM. She was walking along looking down at the ground when she walked into a low hanging gutter, striking the top of her head. There was no LOC, no seeing stars, etc. Shortly after that she developed a HA at the top of the head and some mild neck pain. The HA has persisted since then, no better and no worse. She tried Tylenol and Motrin with mixed results. She denies any blurred vision, no nausea, no dizziness.    Review of Systems  Constitutional: Negative.   Eyes: Negative.   Respiratory: Negative.   Cardiovascular: Negative.   Neurological: Positive for headaches. Negative for dizziness, tremors, seizures, syncope, facial asymmetry, speech difficulty, weakness, light-headedness and numbness.       Objective:   Physical Exam  Constitutional: She is oriented to person, place, and time. She appears well-developed and well-nourished. No distress.  HENT:  Head: Normocephalic and atraumatic.  The top of her head is not tender and there is no swelling   Eyes: Conjunctivae and EOM are normal. Pupils are equal, round, and reactive to light.  Neck: Normal range of motion. Neck supple.  No tenderness  Neurological: She is alert and oriented to person, place, and time. She has normal reflexes. No cranial nerve deficit. She exhibits normal muscle tone. Coordination normal.          Assessment & Plan:  She has a HA after a low impact blow to the head. No signs of a concussion. It very unlikely that she has intracranial bleeding. She is given Vicodin for pain. She will rest this weekend, and her husband will be home with her. She will follow up next week if the HA is not better or if she has any new symptoms.

## 2013-03-21 ENCOUNTER — Telehealth: Payer: Self-pay | Admitting: Family Medicine

## 2013-03-21 NOTE — Telephone Encounter (Signed)
Please call and check on her today Seen at the Saturday clinic

## 2013-03-21 NOTE — Telephone Encounter (Signed)
[facility] Triage Call Report Triage Record Num: [id number] Operator: Donnella Sham Patient Name: Jenny Rangel Call Date & Time: 03/19/2013 10:01:50AM Patient Phone: [phone] PCP: Tillman Abide Patient Gender: Female PCP Fax : [phone] Patient DOB: [birth year] Practice Name: [facility] Reason for Call: Caller: [name]; PCP: Tillman Abide (Family Practice); CB#: [phone]; Call regarding Injury/Trauma; occurred 03/18/13; says she was walking on a sidewalk and didn't see a low gutter and hit her head; c/o HA; didn't sleep well; All emergent sxs of Head Injury protocol r/o except "persistent HA since injury"; disp see within 4hrs; appt scheduled 6/28 at 1115 at [facility] with Dr.Fry Protocol(s) Used: Head Injury Recommended Outcome per Protocol: See Provider within 4 hours Reason for Outcome: Persistent headache since injury Care Advice: ~ SYMPTOM / CONDITION MANAGEMENT 03/19/2013 10:24:26AM Page 1 of 1 [email or web address]

## 2013-03-22 NOTE — Telephone Encounter (Signed)
Left message on machine asking pt how she was doing and if she needed a follow-up appt, advised pt to return my call

## 2013-04-20 ENCOUNTER — Encounter: Payer: Self-pay | Admitting: Internal Medicine

## 2013-04-20 ENCOUNTER — Ambulatory Visit (INDEPENDENT_AMBULATORY_CARE_PROVIDER_SITE_OTHER): Payer: BC Managed Care – PPO | Admitting: Internal Medicine

## 2013-04-20 VITALS — BP 150/70 | HR 108 | Temp 97.7°F | Wt 148.0 lb

## 2013-04-20 DIAGNOSIS — R42 Dizziness and giddiness: Secondary | ICD-10-CM

## 2013-04-20 DIAGNOSIS — R030 Elevated blood-pressure reading, without diagnosis of hypertension: Secondary | ICD-10-CM

## 2013-04-20 LAB — MICROALBUMIN / CREATININE URINE RATIO
Creatinine,U: 18.9 mg/dL
Microalb Creat Ratio: 2.1 mg/g (ref 0.0–30.0)

## 2013-04-20 NOTE — Assessment & Plan Note (Signed)
Associated with headaches ?related to head trauma ?perimenopausal  Will observe for now No worrisome features

## 2013-04-20 NOTE — Progress Notes (Signed)
  Subjective:    Patient ID: Jenny Rangel, female    DOB: 10-07-1960, 52 y.o.   MRN: [id number]  HPI Reviewed Dr Claris Che note from a month ago--blunt trauma to top of head Still some headaches but more trouble with dizziness Wonders if the dizziness could be from the pain med (hydrocodone)  Seems to be worse than same type of dizziness last year after trying benedryl Slight spinning sensation inside---not seeing rotation More just lightheadedness--not really positional (but may be less when lying down) Occasional "lingering" headache Noise worse--working a summer camp  Still has fast heart rate---when nervous Does have pulse more like 72 when at rest and calm No chest pain  Current Outpatient Prescriptions on File Prior to Visit  Medication Sig Dispense Refill  . pantoprazole (PROTONIX) 40 MG tablet Take 40 mg by mouth daily.       No current facility-administered medications on file prior to visit.    No Known Allergies  Past Medical History  Diagnosis Date  . GERD (gastroesophageal reflux disease)   . Nephrolithiasis 1/14    non obstructing--on CT by Dr Marva Panda    Past Surgical History  Procedure Laterality Date  . Vaginal delivery      x4  . Esophagogastroduodenoscopy  08/99    normal    Family History  Problem Relation Age of Onset  . Diabetes Neg Hx   . Heart disease Neg Hx   . Hypertension Neg Hx     History   Social History  . Marital Status: Married    Spouse Name: N/A    Number of Children: 4  . Years of Education: N/A   Occupational History  . teacher, elementary school    Social History Main Topics  . Smoking status: Never Smoker   . Smokeless tobacco: Never Used  . Alcohol Use: No  . Drug Use: No  . Sexually Active: Not on file   Other Topics Concern  . Not on file   Social History Narrative  . No narrative on file   Review of Systems Periods are more irregular Appetite is fine Generally sleeps okay Feels "blah" for the past  year---not clearly depressed. Not anhedonic No memory problems or trouble concentrating    Objective:   Physical Exam  Constitutional: She is oriented to person, place, and time. She appears well-developed and well-nourished. No distress.  Eyes: Conjunctivae and EOM are normal. Pupils are equal, round, and reactive to light.  Fundi benign  Neck: Normal range of motion. Neck supple. No thyromegaly present.  Cardiovascular: Normal rate, regular rhythm and normal heart sounds.  Exam reveals no gallop.   No murmur heard. Pulmonary/Chest: Effort normal and breath sounds normal. No respiratory distress. She has no wheezes. She has no rales.  Abdominal: Soft. There is no tenderness.  Musculoskeletal: She exhibits no edema and no tenderness.  Lymphadenopathy:    She has no cervical adenopathy.  Neurological: She is alert and oriented to person, place, and time. She has normal strength. No cranial nerve deficit. Coordination and gait normal.  Psychiatric: She has a normal mood and affect. Her behavior is normal.          Assessment & Plan:

## 2013-04-20 NOTE — Patient Instructions (Signed)
Please check blood pressure 2-3 times a month. Bring in the numbers at your next appointment

## 2013-04-20 NOTE — Assessment & Plan Note (Signed)
BP Readings from Last 3 Encounters:  04/20/13 150/70  03/19/13 150/84  01/03/13 150/80   ?perimenopausal but doesn't remember preeclampsia Repeat on right 146/80 Some degree of white coat hypertension Discussed Rx She will monitor at home Only Rx now if has elevated urine microal

## 2013-04-28 ENCOUNTER — Ambulatory Visit (INDEPENDENT_AMBULATORY_CARE_PROVIDER_SITE_OTHER): Payer: BC Managed Care – PPO | Admitting: Family Medicine

## 2013-04-28 ENCOUNTER — Ambulatory Visit: Payer: BC Managed Care – PPO | Admitting: Family Medicine

## 2013-04-28 ENCOUNTER — Encounter: Payer: Self-pay | Admitting: Family Medicine

## 2013-04-28 VITALS — BP 120/86 | HR 92 | Temp 97.7°F | Wt 145.0 lb

## 2013-04-28 DIAGNOSIS — H539 Unspecified visual disturbance: Secondary | ICD-10-CM

## 2013-04-28 DIAGNOSIS — R42 Dizziness and giddiness: Secondary | ICD-10-CM

## 2013-04-28 NOTE — Patient Instructions (Addendum)
Get set up with eye exam. Stop at front desk to set up referral for neurology. Okay to use benadryl on limited basis for sleep.  Work on relaxation and stress reduction. Consider following up with Dr. Alphonsus Sias to discuss anxiety and stres

## 2013-04-28 NOTE — Progress Notes (Signed)
Subjective:    Patient ID: Jenny Rangel, female    DOB: [birth year], 52 y.o.   MRN: [id number]  HPI 52 year old female pt of Dr. Karle Starch presents with  New issues.  She has a minor head trauma 6 weeks ago, no LOC. Saw Dr. Clent Ridges. No imaging done at that point.  Saw Dr. Alphonsus Sias 1 week ago for dizziness (room spinning, light headed, not presyncopal) and headaches. Flet no worrisome features. No further eval done.  BP nml... Thought likely due to anxiety.  Since she was seen by PCP...she has had some decreased headache, but continued dizziness, occ wakes her up at night. The most concerning and interfering symptoms for her is: She has noted lights and faces "jumping" as she is dozing off at night in last 5 days. When eyes ar closed only. DO NOT proceed headache. Trouble sleeping due to this. Only slept 15 min last night. Tried to use benadryl and this helped he sleep.  No blurred or double vision. No balance issues. No confusion, no slurred speech, no weakness, no confusion. No N/V.  She has had 3 MRI in past, nml. One MRI 3 years ago nml. To eval headaches, dizziness and numbness in past. No suggestion of MS.    Review of Systems  Constitutional: Negative for fever and fatigue.  HENT: Negative for ear pain.   Eyes: Negative for pain.  Respiratory: Negative for chest tightness and shortness of breath.   Cardiovascular: Negative for chest pain, palpitations and leg swelling.  Gastrointestinal: Negative for abdominal pain.  Genitourinary: Negative for dysuria.       Objective:   Physical Exam  Constitutional: She is oriented to person, place, and time. Vital signs are normal. She appears well-developed and well-nourished. She is cooperative.  Non-toxic appearance. She does not appear ill. No distress.  HENT:  Head: Normocephalic.  Right Ear: Hearing, tympanic membrane, external ear and ear canal normal. Tympanic membrane is not erythematous, not retracted and not bulging.  Left  Ear: Hearing, tympanic membrane, external ear and ear canal normal. Tympanic membrane is not erythematous, not retracted and not bulging.  Nose: No mucosal edema or rhinorrhea. Right sinus exhibits no maxillary sinus tenderness and no frontal sinus tenderness. Left sinus exhibits no maxillary sinus tenderness and no frontal sinus tenderness.  Mouth/Throat: Uvula is midline, oropharynx is clear and moist and mucous membranes are normal.  Eyes: Conjunctivae, EOM and lids are normal. Pupils are equal, round, and reactive to light. No foreign bodies found.  Neck: Trachea normal and normal range of motion. Neck supple. Carotid bruit is not present. No mass and no thyromegaly present.  Cardiovascular: Normal rate, regular rhythm, S1 normal, S2 normal, normal heart sounds, intact distal pulses and normal pulses.  Exam reveals no gallop and no friction rub.   No murmur heard. Pulmonary/Chest: Effort normal and breath sounds normal. Not tachypneic. No respiratory distress. She has no decreased breath sounds. She has no wheezes. She has no rhonchi. She has no rales.  Abdominal: Soft. Normal appearance and bowel sounds are normal. There is no tenderness.  Neurological: She is alert and oriented to person, place, and time. She has normal strength and normal reflexes. No cranial nerve deficit or sensory deficit. She displays a negative Romberg sign. Gait normal.  Skin: Skin is warm, dry and intact. No rash noted.  Psychiatric: Her speech is normal and behavior is normal. Judgment and thought content normal. Her mood appears anxious. Cognition and memory are normal. She does  not exhibit a depressed mood.  Pt appear mild to moderately anxious          Assessment & Plan:   Flashing lights, vertigo, improving headahces following minor head trauma 6 weeks ago. Nml neuro exam. Headaches are improving and no other neuro changes other than flashing lights ONLY when closing eyes. No red flags.  No clear indication  for head CT or MRI... Doubt CVA, head bleed etc. Recommend eye eval to rule out new eye issue.  Refer to neuro given pt concern level. Unlikely MS or seizures. Possibly symptoms are all related to anxiety.. Pt does agree that there is an anxiety component now, but was not at start of vertigo and lights.

## 2013-05-24 ENCOUNTER — Ambulatory Visit (INDEPENDENT_AMBULATORY_CARE_PROVIDER_SITE_OTHER): Payer: BC Managed Care – PPO | Admitting: Neurology

## 2013-05-24 ENCOUNTER — Encounter: Payer: Self-pay | Admitting: Neurology

## 2013-05-24 VITALS — BP 144/70 | HR 98 | Temp 98.5°F | Ht 69.0 in | Wt 146.0 lb

## 2013-05-24 DIAGNOSIS — H538 Other visual disturbances: Secondary | ICD-10-CM

## 2013-05-24 NOTE — Progress Notes (Signed)
NEUROLOGY CONSULTATION NOTE  Jenny Rangel MRN: [id number] DOB: 1961/06/21  Referring provider: Dr. Ermalene Searing Primary care provider: Dr. Alphonsus Sias  Reason for consult:  Flickering lights  HISTORY OF PRESENT ILLNESS: Jenny Rangel is a 52 year old woman with hypertension and GERD who presents for evaluation of visual disturbance.  Records and images were personally reviewed where available.    On 03/18/13, she hit the top of her head on a low-hanging gutter while walking.  There was no loss of consciousness, blurred vision or dizziness, but she developed headache and mild neck pain.  She took hydrocodone and Tylenol for pain.  However, she began developing increased dizziness.  She describes the dizziness as spinning sensation, similar to spells she had in the past.  She also had accompanied headache involving the back of the neck and top of the head, a pounding quality.  This has since subsided, however over the past month, she exhibits visual disturbance when she closes her eyes.  It usually only occurs when she is laying in bed at night.  With her eyes closed, she will have a brief spell of flickering of light, as well as a "jolt"-like sensation of the back of the head.  It is not a pain, but rather an unusual sensation.  This "jolt" sensation actually began even prior to hitting her head.  It doesn't occur all the time and it is very brief.  This experience has made her quite anxious.  She notes that she is probably in the process of menopause.  It has been approximately 4 months since she last menstruated.  Also, she has been experiencing episodes of hot flashes, palpitations and acid reflux.  She denies any focal symptoms.  She does not have a history of migraine.  She had an MRI of the brain performed on 06/03/10 for reported speech and word-finding difficulties.  Images were personally reviewed.  It was normal.  PAST MEDICAL HISTORY: Past Medical History  Diagnosis Date  . GERD (gastroesophageal  reflux disease)   . Nephrolithiasis 1/14    non obstructing--on CT by Dr Marva Panda    PAST SURGICAL HISTORY: Past Surgical History  Procedure Laterality Date  . Vaginal delivery      x4  . Esophagogastroduodenoscopy  08/99    normal    MEDICATIONS: Current Outpatient Prescriptions on File Prior to Visit  Medication Sig Dispense Refill  . pantoprazole (PROTONIX) 40 MG tablet Take 40 mg by mouth daily.       No current facility-administered medications on file prior to visit.    ALLERGIES: No Known Allergies  FAMILY HISTORY: Family History  Problem Relation Age of Onset  . Diabetes Neg Hx   . Heart disease Neg Hx   . Hypertension Neg Hx     SOCIAL HISTORY: History   Social History  . Marital Status: Married    Spouse Name: N/A    Number of Children: 4  . Years of Education: N/A   Occupational History  . teacher, elementary school    Social History Main Topics  . Smoking status: Never Smoker   . Smokeless tobacco: Never Used  . Alcohol Use: No     Comment: occ  . Drug Use: No  . Sexual Activity: Not on file   Other Topics Concern  . Not on file   Social History Narrative  . No narrative on file    REVIEW OF SYSTEMS: Constitutional: No fevers, chills, or sweats, no generalized fatigue, change in appetite Eyes:  No visual changes, double vision, eye pain Ear, nose and throat: No hearing loss, ear pain, nasal congestion, sore throat Cardiovascular: No chest pain, palpitations Respiratory:  No shortness of breath at rest or with exertion, wheezes GastrointestinaI: No nausea, vomiting, diarrhea, abdominal pain, fecal incontinence Genitourinary:  No dysuria, urinary retention or frequency Musculoskeletal:  No neck pain, back pain Integumentary: No rash, pruritus, skin lesions Neurological: as above Psychiatric: No depression, insomnia, anxiety Endocrine: No palpitations, fatigue, diaphoresis, mood swings, change in appetite, change in weight, increased  thirst Hematologic/Lymphatic:  No anemia, purpura, petechiae. Allergic/Immunologic: no itchy/runny eyes, nasal congestion, recent allergic reactions, rashes  PHYSICAL EXAM: Filed Vitals:   05/24/13 1326  BP: 144/70  Pulse: 98  Temp: 98.5 F (36.9 C)   General: No acute distress Head:  Normocephalic/atraumatic Neck: supple, no paraspinal tenderness, full range of motion Back: No paraspinal tenderness Heart: regular rate and rhythm Lungs: Clear to auscultation bilaterally. Vascular: No carotid bruits. Neurological Exam: Mental status: alert and oriented to person, place, and time, speech fluent and not dysarthric, language intact. Cranial nerves: CN I: not tested CN II: pupils equal, round and reactive to light, visual fields intact, fundi unremarkable. CN III, IV, VI:  full range of motion, no nystagmus, no ptosis CN V: facial sensation intact CN VII: upper and lower face symmetric CN VIII: hearing intact CN IX, X: gag intact, uvula midline CN XI: sternocleidomastoid and trapezius muscles intact CN XII: tongue midline Bulk & Tone: normal, no fasciculations. Motor: 5/5 throughout Sensation: temperature and vibration intact Deep Tendon Reflexes: 3+ throughout, toes down Finger to nose testing: normal Heel to shin: normal Gait: normal, able to walk in tandem. Romberg normal.  IMPRESSION & PLAN: Brief flashing lights with eyes closed.  Not really sure what to make of it from a neurological standpoint.  It could be residual from mild concussion, although unlikely given that onset occurred several weeks later.  May potentially be symptom of menopause as well.  It is not associated with a headache to suggest migraine and it does not seem like a seizure.  Her normal neurological exam is reassuring.  At this point, we will get an MRI of the brain to rule out anything structural.  Further recommendations pending results.  45 minutes spent with patient, over 50% spent counseling and  coordinating care.  Thank you for allowing me to take part in the care of this patient.  Shon Millet, DO  CC:  Tillman Abide, MD  Kerby Nora, MD

## 2013-05-24 NOTE — Patient Instructions (Addendum)
At this point, I really don't know the cause of the lights.  It may be related to hitting your head, but it would be unusual to start several weeks after a small concussion.  I think we should get an MRI of the brain to make sure there isn't anything worrisome.  Your normal neurological exam today is reassuring.   Your MRI is scheduled at [facility] on Tuesday, September 9th at 3:00 pm.  Please check in at the first floor radiology department 15 minutes prior to your scheduled appointment time. Please enter the hospital at Entrance A which is off of [address].     [phone].

## 2013-05-31 ENCOUNTER — Ambulatory Visit (HOSPITAL_COMMUNITY): Admission: RE | Admit: 2013-05-31 | Payer: BC Managed Care – PPO | Source: Ambulatory Visit

## 2013-06-23 IMAGING — MG MM CAD SCREENING MAMMO
1 series · 4 of 4 positions shown · non-contrast
Comparison: none

REASON FOR EXAM: SCR MAMMO NO ORDER
COMMENTS:

PROCEDURE:     MAM - MAM DGTL SCRN MAM NO ORDER W/CAD  - November 09, 2012  [DATE]
RESULT:     Bilateral digital screening mammogram with CAD dated 11/09/2012
Comparisons: 10/23/2010, 07/19/2008, 06/21/2007

[R CC · right · 4 of 4 slices shown]
[im 1/4]
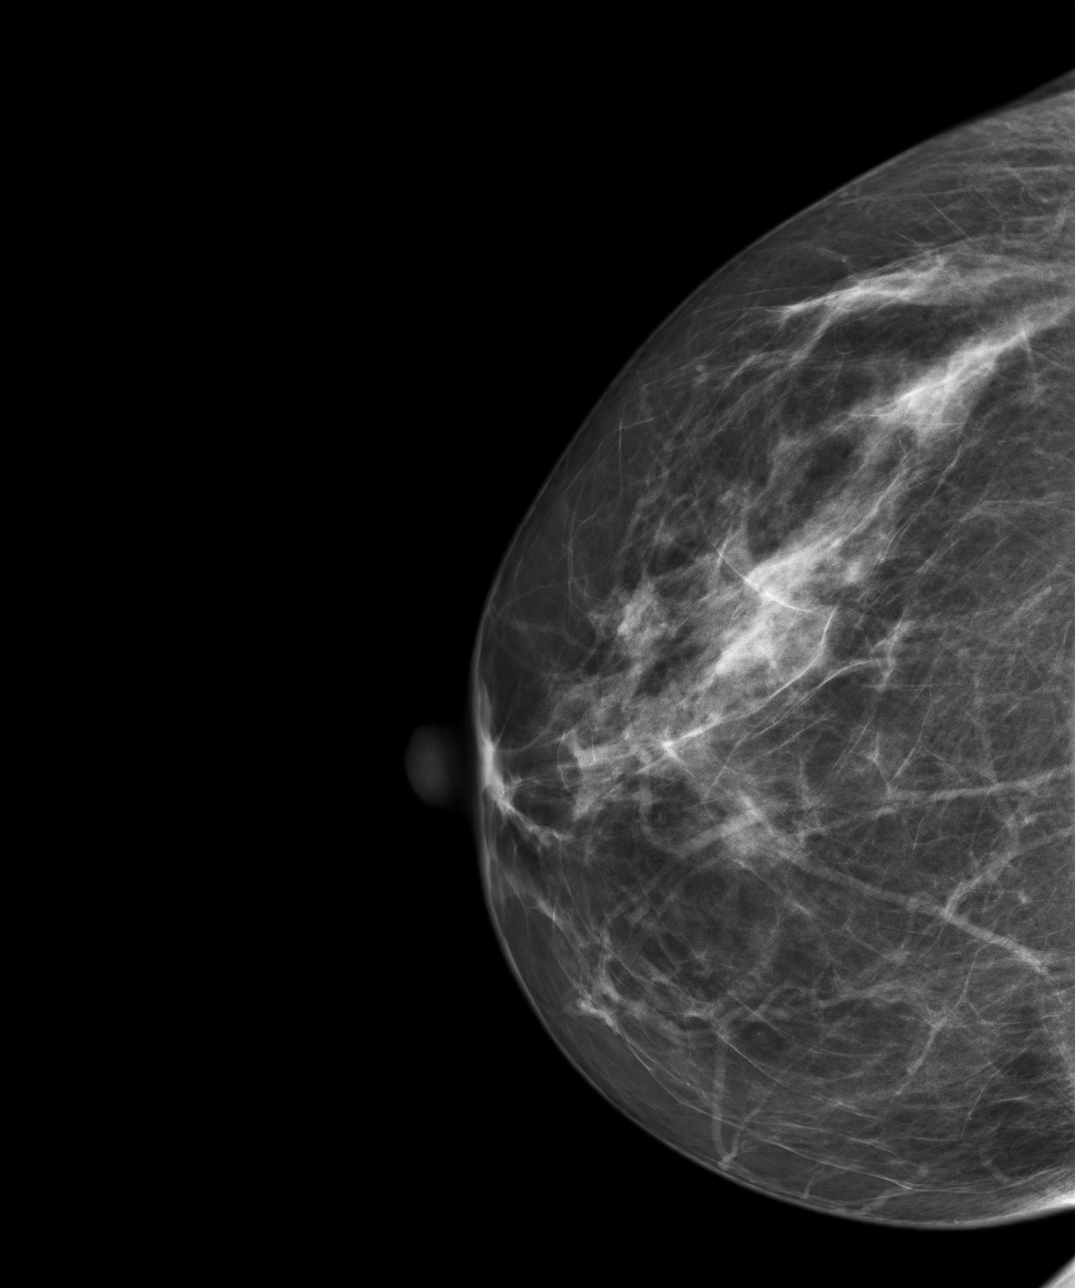
[im 2/4]
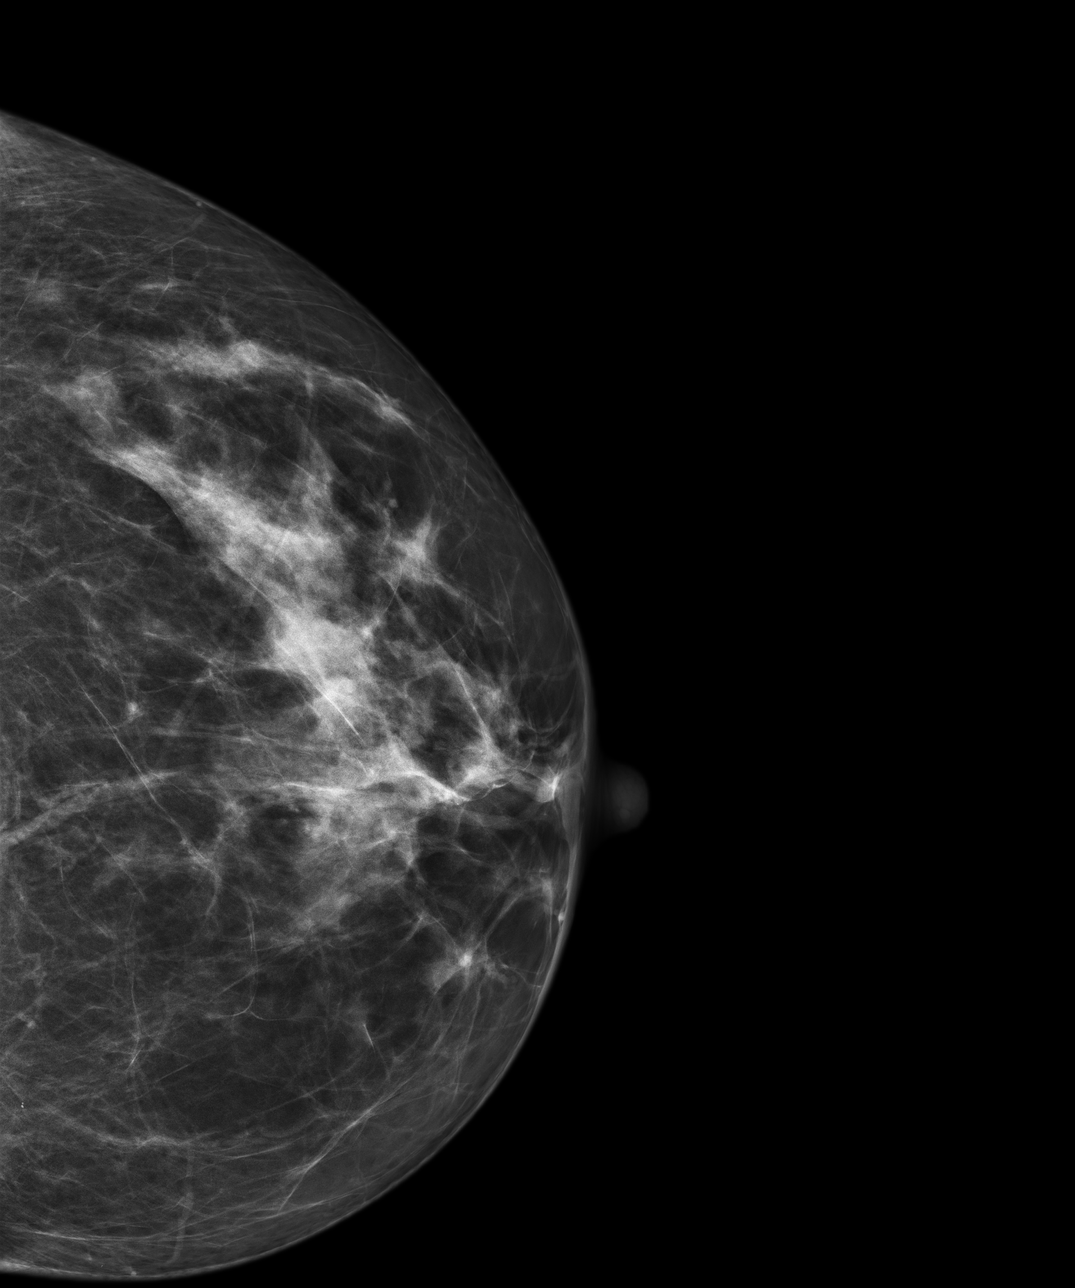
[im 3/4]
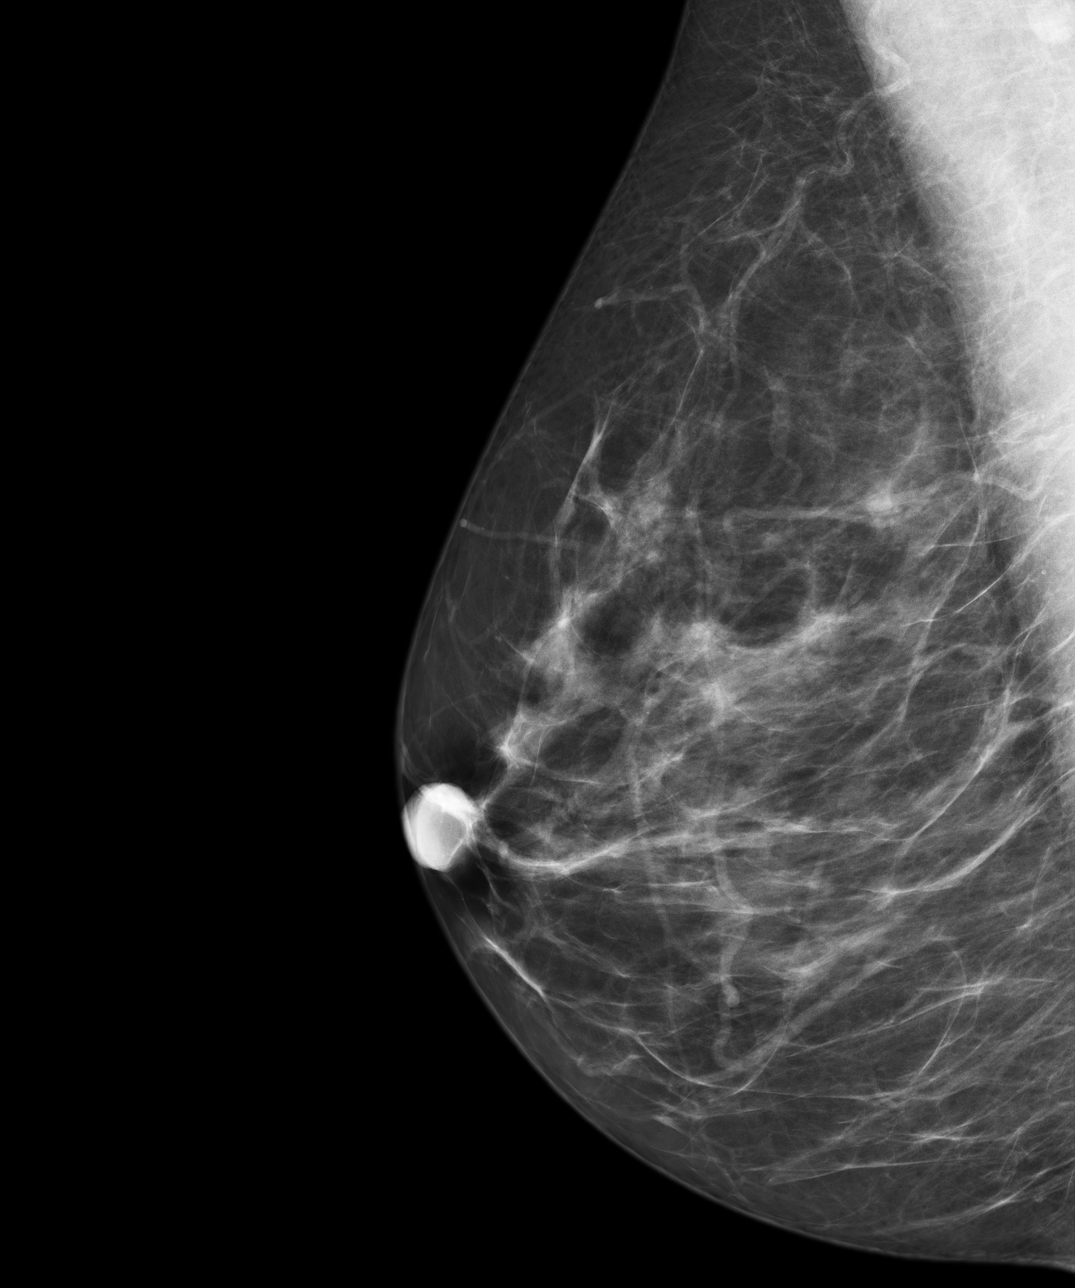
[im 4/4]
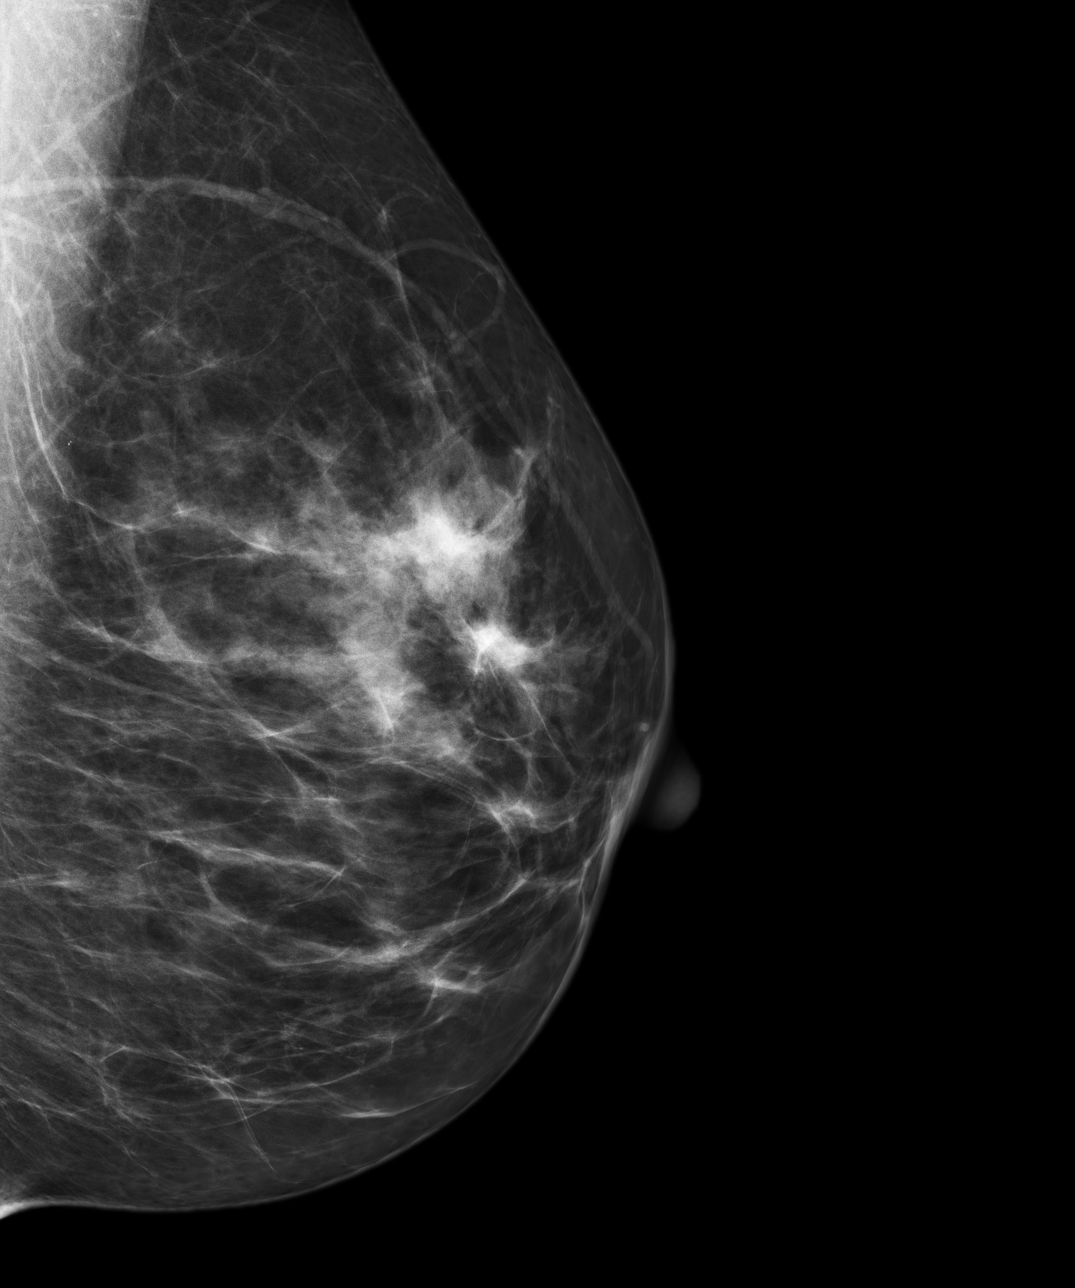

[4 of 4 positions shown; findings below may reference images not displayed]

FINDINGS: BREAST COMPOSITION: The breast composition is SCATTERED FIBROGLANDULAR
TISSUE (glandular tissue is 25-50%)

There is no new radiographic evidence to suggest malignancy.
IMPRESSION: BI-RADS: Category 2- Benign Finding

A NEGATIVE MAMMOGRAM REPORT DOES NOT PRECLUDE BIOPSY OR OTHER EVALUATION OF
A CLINICALLY PALPABLE OR OTHERWISE SUSPICIOUS MASS OR LESION. BREAST CANCER
MAY NOT BE DETECTED IN UP TO 10% OF CASES.

## 2013-07-25 ENCOUNTER — Ambulatory Visit: Payer: BC Managed Care – PPO | Admitting: Internal Medicine

## 2013-07-28 ENCOUNTER — Other Ambulatory Visit: Payer: Self-pay

## 2013-12-14 ENCOUNTER — Ambulatory Visit: Payer: Self-pay | Admitting: Obstetrics and Gynecology

## 2014-12-11 ENCOUNTER — Ambulatory Visit: Payer: Self-pay | Admitting: Gastroenterology

## 2014-12-21 ENCOUNTER — Encounter: Payer: Self-pay | Admitting: Internal Medicine

## 2015-01-03 ENCOUNTER — Encounter: Payer: Self-pay | Admitting: Internal Medicine

## 2015-01-15 LAB — SURGICAL PATHOLOGY

## 2015-01-17 ENCOUNTER — Encounter: Payer: Self-pay | Admitting: Internal Medicine

## 2015-01-23 ENCOUNTER — Encounter: Payer: BC Managed Care – PPO | Admitting: Physical Therapy

## 2015-01-25 ENCOUNTER — Encounter: Payer: BC Managed Care – PPO | Admitting: Physical Therapy

## 2015-01-30 ENCOUNTER — Encounter: Payer: BC Managed Care – PPO | Admitting: Physical Therapy

## 2015-02-01 ENCOUNTER — Encounter: Payer: BC Managed Care – PPO | Admitting: Physical Therapy

## 2015-02-06 ENCOUNTER — Encounter: Payer: BC Managed Care – PPO | Admitting: Physical Therapy

## 2015-02-08 ENCOUNTER — Encounter: Payer: BC Managed Care – PPO | Admitting: Physical Therapy

## 2016-01-24 ENCOUNTER — Other Ambulatory Visit (HOSPITAL_COMMUNITY): Payer: Self-pay | Admitting: Gastroenterology

## 2016-01-24 DIAGNOSIS — F458 Other somatoform disorders: Secondary | ICD-10-CM

## 2016-01-30 ENCOUNTER — Ambulatory Visit: Payer: BC Managed Care – PPO

## 2016-01-31 ENCOUNTER — Ambulatory Visit: Payer: BC Managed Care – PPO

## 2016-07-03 ENCOUNTER — Other Ambulatory Visit: Payer: Self-pay | Admitting: Obstetrics and Gynecology

## 2016-07-03 DIAGNOSIS — Z1239 Encounter for other screening for malignant neoplasm of breast: Secondary | ICD-10-CM

## 2016-08-04 ENCOUNTER — Ambulatory Visit
Admission: RE | Admit: 2016-08-04 | Discharge: 2016-08-04 | Disposition: A | Discharge status: Home / Self Care | Payer: BC Managed Care – PPO | Source: Ambulatory Visit | Attending: Obstetrics and Gynecology | Admitting: Obstetrics and Gynecology

## 2016-08-04 ENCOUNTER — Other Ambulatory Visit: Payer: Self-pay | Admitting: Obstetrics and Gynecology

## 2016-08-04 DIAGNOSIS — Z1231 Encounter for screening mammogram for malignant neoplasm of breast: Secondary | ICD-10-CM | POA: Diagnosis not present

## 2016-08-04 DIAGNOSIS — Z1239 Encounter for other screening for malignant neoplasm of breast: Secondary | ICD-10-CM

## 2017-01-14 ENCOUNTER — Encounter (INDEPENDENT_AMBULATORY_CARE_PROVIDER_SITE_OTHER): Payer: BC Managed Care – PPO | Admitting: Ophthalmology

## 2017-01-14 ENCOUNTER — Other Ambulatory Visit (HOSPITAL_COMMUNITY)
Admission: RE | Admit: 2017-01-14 | Discharge: 2017-01-14 | Disposition: A | Discharge status: Home / Self Care | Payer: BC Managed Care – PPO | Source: Ambulatory Visit | Attending: Ophthalmology | Admitting: Ophthalmology

## 2017-01-14 DIAGNOSIS — H43813 Vitreous degeneration, bilateral: Secondary | ICD-10-CM

## 2017-01-14 DIAGNOSIS — Z01812 Encounter for preprocedural laboratory examination: Secondary | ICD-10-CM | POA: Insufficient documentation

## 2017-01-14 DIAGNOSIS — H31001 Unspecified chorioretinal scars, right eye: Secondary | ICD-10-CM

## 2017-01-14 DIAGNOSIS — H2513 Age-related nuclear cataract, bilateral: Secondary | ICD-10-CM

## 2017-01-14 DIAGNOSIS — H4602 Optic papillitis, left eye: Secondary | ICD-10-CM | POA: Diagnosis not present

## 2017-01-14 LAB — C-REACTIVE PROTEIN

## 2017-01-14 LAB — CBC
HCT: 40.2 % (ref 36.0–46.0)
HEMOGLOBIN: 13.2 g/dL (ref 12.0–15.0)
MCH: 29.7 pg (ref 26.0–34.0)
MCHC: 32.8 g/dL (ref 30.0–36.0)
MCV: 90.3 fL (ref 78.0–100.0)
Platelets: 212 10*3/uL (ref 150–400)
RBC: 4.45 MIL/uL (ref 3.87–5.11)
RDW: 13.3 % (ref 11.5–15.5)
WBC: 8.1 10*3/uL (ref 4.0–10.5)

## 2017-01-14 LAB — SEDIMENTATION RATE: SED RATE: 10 mm/h (ref 0–22)

## 2017-01-26 ENCOUNTER — Telehealth: Payer: Self-pay | Admitting: *Deleted

## 2017-01-26 NOTE — Telephone Encounter (Signed)
Pt left message at triage requesting a referral to a [facility] dr. She recently had a CT completed and was advised of a tumor and nodules and that she needed to contact her PCP for a specialist referral. pls advise

## 2017-01-26 NOTE — Telephone Encounter (Signed)
Spoke to her  She went to [facility] by referral to optic nerve specialist due to optic neuritis. They felt it was more of a retinal occlusion issue Did MRI on brain and then lots of blood work Everything normal except slightly elevated "enzymes" (??liver) ??some concern for autoimmune disease so wanted to start prednisone So CT chest also done--- showed thymus enlargement  May need referral for further evaluation---she wants [facility] if she needs referral  Morey Hummingbird, I really need these blood and CT/MRI reports She is going to try also by calling them in the morning

## 2017-01-26 NOTE — Telephone Encounter (Signed)
Patient called about message.  Patient said she went to [facility]-phone number [phone].  Patient was having trouble seeing in one eye and they were looking for an underlying reason.  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  They suggested patient follow up with Dr.Letvak.  Patient has seen several doctors and wanted to know if Dr.Letvak would refer her to a doctor at [facility].  Patient said they didn't suggest who she should be referred. I will fax a request for records to [facility].

## 2017-01-26 NOTE — Telephone Encounter (Signed)
Please get more information Nothing in Care Everywhere What was the CT of? Who do they want her to see??

## 2017-01-27 ENCOUNTER — Other Ambulatory Visit: Payer: Self-pay | Admitting: Internal Medicine

## 2017-01-27 ENCOUNTER — Telehealth: Payer: Self-pay | Admitting: Internal Medicine

## 2017-01-27 DIAGNOSIS — D4989 Neoplasm of unspecified behavior of other specified sites: Secondary | ICD-10-CM

## 2017-01-27 DIAGNOSIS — D150 Benign neoplasm of thymus: Principal | ICD-10-CM

## 2017-01-27 NOTE — Telephone Encounter (Signed)
thanks

## 2017-01-27 NOTE — Telephone Encounter (Signed)
I called [facility] yesterday and they told me to fax over a request for records on letterhead.  I faxed a fax cover sheet and put on it Stat request for records and put my fax number on it yesterday.  I'll give it to you as soon as I receive it.

## 2017-01-27 NOTE — Progress Notes (Signed)
Reviewed records from [facility]. 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 Next step is thoracic surgery evaluation to decide if resection is feasible

## 2017-01-27 NOTE — Telephone Encounter (Signed)
Called patient and told her we faxed her records to [facility] and that they would be calling her after they review. She was wanting to talk to Dr Silvio Pate again now that he has seen her records from [facility] as she still feels like she is in the dark. Please call patient directly, cell number is the best.

## 2017-01-27 NOTE — Telephone Encounter (Signed)
The records are on your desk and I let patient know we received the records.  She wants to know if referral will be done today.

## 2017-01-27 NOTE — Telephone Encounter (Signed)
Let her know that I will get back to her within the next few days---it may take a bit to go through everything

## 2017-01-28 NOTE — Telephone Encounter (Signed)
Discussed with her This may be an incidental finding  She still needs to proceed with thoracic surgery evaluation---likely will recommend resection to be sure it is benign---but no reason for tremendous rush

## 2017-02-09 ENCOUNTER — Ambulatory Visit (INDEPENDENT_AMBULATORY_CARE_PROVIDER_SITE_OTHER): Payer: BC Managed Care – PPO | Admitting: Family Medicine

## 2017-02-09 ENCOUNTER — Encounter: Payer: Self-pay | Admitting: Family Medicine

## 2017-02-09 VITALS — BP 150/90 | HR 122 | Temp 97.7°F | Ht 69.0 in | Wt 152.0 lb

## 2017-02-09 DIAGNOSIS — J069 Acute upper respiratory infection, unspecified: Secondary | ICD-10-CM

## 2017-02-09 NOTE — Progress Notes (Signed)
SUBJECTIVE:  Jenny Rangel is a 56 y.o. female pt of Dr. Silvio Pate, who complains of coryza and congestion for 5 days. She denies a history of anorexia and chest pain and denies a history of asthma. Patient denies smoke cigarettes.  She has been on prednisone 60 mg daily few weeks for retinal occlusion.   Current Outpatient Prescriptions on File Prior to Visit  Medication Sig Dispense Refill  . cyanocobalamin 500 MCG tablet Take 500 mcg by mouth daily.    . pantoprazole (PROTONIX) 40 MG tablet Take 40 mg by mouth daily.     No current facility-administered medications on file prior to visit.     No Known Allergies  Past Medical History:  Diagnosis Date  . GERD (gastroesophageal reflux disease)   . Nephrolithiasis 1/14   non obstructing--on CT by Dr Gustavo Lah    Past Surgical History:  Procedure Laterality Date  . ESOPHAGOGASTRODUODENOSCOPY  08/99   normal  . VAGINAL DELIVERY     x4    Family History  Problem Relation Age of Onset  . Diabetes Neg Hx   . Heart disease Neg Hx   . Hypertension Neg Hx   . Ataxia Neg Hx   . Chorea Neg Hx   . Dementia Neg Hx   . Mental retardation Neg Hx   . Migraines Neg Hx   . Multiple sclerosis Neg Hx   . Neurofibromatosis Neg Hx   . Neuropathy Neg Hx   . Parkinsonism Neg Hx   . Seizures Neg Hx   . Stroke Neg Hx     Social History   Social History  . Marital status: Married    Spouse name: N/A  . Number of children: 4  . Years of education: N/A   Occupational History  . teacher, elementary school    Social History Main Topics  . Smoking status: Never Smoker  . Smokeless tobacco: Never Used  . Alcohol use No     Comment: occ  . Drug use: No  . Sexual activity: Not on file   Other Topics Concern  . Not on file   Social History Narrative  . No narrative on file   The PMH, PSH, Social History, Family History, Medications, and allergies have been reviewed in [facility], and have been updated if relevant.  OBJECTIVE:  BP (!)  150/90   Pulse (!) 122   Temp 97.7 F (36.5 C)   Ht 5\' 9"  (1.753 m)   Wt 152 lb 0.1 oz (68.9 kg)   SpO2 98%   BMI 22.45 kg/m   She appears well, vital signs are as noted. Ears normal.  Throat and pharynx normal.  Neck supple. No adenopathy in the neck. Nose is congested. Sinuses non tender. The chest is clear, without wheezes or rales.  ASSESSMENT:  viral upper respiratory illness  PLAN: Symptomatic therapy suggested: push fluids, rest and return office visit prn if symptoms persist or worsen. Lack of antibiotic effectiveness discussed with her. Call or return to clinic prn if these symptoms worsen or fail to improve as anticipated.

## 2017-02-17 ENCOUNTER — Telehealth: Payer: Self-pay | Admitting: Internal Medicine

## 2017-02-17 NOTE — Telephone Encounter (Signed)
I could have added her tomorrow at 12:45PM but am not here in the afternoon. Can add her on Thursday at 4:45 if she can wait till then

## 2017-02-17 NOTE — Telephone Encounter (Signed)
Pt is out of town and will be returning home 02/18/17 at 2pm. Pt only wants to see Dr Silvio Pate. Pt has had palpitations before but is unsettling and pt wants to see her doctor; have offered appts at other [facility] sites but again pt only wants to see Dr Silvio Pate. Pt request cb after Dr Silvio Pate reviews. Pt understands it may be 02/18/17 when gets cb and if condition worsens pt will go to [facility] where she is at now but pt does not want to do that unless last resort.

## 2017-02-17 NOTE — Telephone Encounter (Signed)
Patient Name: Jenny Rangel  DOB: [birth year]    Initial Comment Caller is having heart palpitations    Nurse Assessment  Nurse: Raphael Gibney, RN, Vanita Ingles Date/Time (Eastern Time): 02/17/2017 12:49:53 PM  Confirm and document reason for call. If symptomatic, describe symptoms. ---Caller states she is having heart palpitations. has had heart palpitations. She feels a " jump" in her chest. Pulse is 100. She is on prednisone. She spoke to the doctor who decreased her dosage from 50 mg to 40 mg.  Does the patient have any new or worsening symptoms? ---Yes  Will a triage be completed? ---Yes  Related visit to physician within the last 2 weeks? ---No  Does the PT have any chronic conditions? (i.e. diabetes, asthma, etc.) ---No  Is the patient pregnant or possibly pregnant? (Ask all females between the ages of 6-55) ---No  Is this a behavioral health or substance abuse call? ---No     Guidelines    Guideline Title Affirmed Question Affirmed Notes  Heart Rate and Heartbeat Questions [1] Palpitations AND [2] no improvement after following Care Advice    Final Disposition User   See PCP When Office is Open (within 3 days) Raphael Gibney, RN, Vanita Ingles    Comments  pt wants appt tomorrow afternoon after 3 pm. No appts available. She only wants to see Dr. Silvio Pate. Please call pt back regarding appt.  has had heart palpitations in the past. Has had intermittent heart palpitations the past few days.   Referrals  REFERRED TO PCP OFFICE   Disagree/Comply: Comply

## 2017-02-18 ENCOUNTER — Ambulatory Visit: Payer: BC Managed Care – PPO | Admitting: Internal Medicine

## 2017-02-18 NOTE — Telephone Encounter (Signed)
Okay Can add her on once she gets here (if she makes it)

## 2017-02-18 NOTE — Telephone Encounter (Signed)
Patient said she'd try to come in today at 12:45.  Patient said if she can't come at that time, she would give me a call back.

## 2017-02-19 ENCOUNTER — Ambulatory Visit: Payer: BC Managed Care – PPO | Admitting: Internal Medicine

## 2017-02-19 ENCOUNTER — Encounter: Payer: Self-pay | Admitting: Primary Care

## 2017-02-19 ENCOUNTER — Ambulatory Visit (INDEPENDENT_AMBULATORY_CARE_PROVIDER_SITE_OTHER): Payer: BC Managed Care – PPO | Admitting: Primary Care

## 2017-02-19 VITALS — BP 170/100 | HR 114 | Temp 98.4°F | Ht 69.0 in | Wt 156.0 lb

## 2017-02-19 DIAGNOSIS — R002 Palpitations: Secondary | ICD-10-CM | POA: Diagnosis not present

## 2017-02-19 LAB — BASIC METABOLIC PANEL
BUN: 11 mg/dL (ref 6–23)
CALCIUM: 9.3 mg/dL (ref 8.4–10.5)
CO2: 30 meq/L (ref 19–32)
CREATININE: 0.72 mg/dL (ref 0.40–1.20)
Chloride: 105 mEq/L (ref 96–112)
GFR: 89.9 mL/min (ref >60.00)
GLUCOSE: 114 mg/dL — AB (ref 70–99)
Potassium: 4 mEq/L (ref 3.5–5.1)
SODIUM: 141 meq/L (ref 135–145)

## 2017-02-19 LAB — CBC WITH DIFFERENTIAL/PLATELET
BASOS ABS: 0 10*3/uL (ref 0.0–0.1)
BASOS PCT: 0.1 % (ref 0.0–3.0)
EOS ABS: 0 10*3/uL (ref 0.0–0.7)
Eosinophils Relative: 0.3 % (ref 0.0–5.0)
HCT: 38.4 % (ref 36.0–46.0)
Hemoglobin: 12.6 g/dL (ref 12.0–15.0)
LYMPHS ABS: 0.9 10*3/uL (ref 0.7–4.0)
LYMPHS PCT: 6.3 % — AB (ref 12.0–46.0)
MCHC: 32.9 g/dL (ref 30.0–36.0)
MCV: 91.4 fl (ref 78.0–100.0)
MONO ABS: 0.4 10*3/uL (ref 0.1–1.0)
Monocytes Relative: 2.8 % — ABNORMAL LOW (ref 3.0–12.0)
NEUTROS ABS: 12.3 10*3/uL — AB (ref 1.4–7.7)
NEUTROS PCT: 90.5 % — AB (ref 43.0–77.0)
PLATELETS: 348 10*3/uL (ref 150.0–400.0)
RBC: 4.2 Mil/uL (ref 3.87–5.11)
RDW: 13.9 % (ref 11.5–15.5)
WBC: 13.6 10*3/uL — ABNORMAL HIGH (ref 4.0–10.5)

## 2017-02-19 LAB — TSH: TSH: 1.82 u[IU]/mL (ref 0.35–4.50)

## 2017-02-19 LAB — MAGNESIUM: MAGNESIUM: 2.3 mg/dL (ref 1.5–2.5)

## 2017-02-19 MED ORDER — METOPROLOL TARTRATE 25 MG PO TABS: 25.0000 mg | ORAL_TABLET | Freq: Two times a day (BID) | ORAL | 0 refills | Status: DC

## 2017-02-19 NOTE — Patient Instructions (Signed)
Complete lab work prior to leaving today. I will notify you of your results once received.   Start metoprolol tartrate 25 mg tablets for palpitations and high blood pressure. Take 1 tablet by mouth twice daily.  Start monitoring your blood pressure daily, record your readings, bring your readings to your next visit.  Schedule a follow up visit with Dr. Silvio Pate in two weeks.  It was a pleasure meeting you!

## 2017-02-19 NOTE — Progress Notes (Signed)
Subjective:    Patient ID: Jenny Rangel, female    DOB: 04-09-1961, 56 y.o.   MRN: [id number]  HPI  Jenny Rangel is a 56 year old female with a history of tachycardia, GERD, anemia, who presents today with a chief complaint of palpitations.   She called in to our triage line on 02/17/17 with reports of palpitations with a pulse of 100. She is also on prednisone for an arterial eye occulusion. She's been on prednisone for the past four weeks and was recently reduced to 40 mg. Her palpitations have been intermittent for the past several days. She's been checking her BP sporadically, last week she was 117/70's. Historically she's had high blood pressure in the clinic. Over the past several days her HR is running 80's-low 100's.  Her BP in the office today is 170/100 and her HR is 114. She does have a dry cough with a tickle and irritation to the back of her throat. She does have occasional post nasal drip and has occasional reflux. She had a cold about 1 week ago with low grade fever, overall feeling better. She denies chest pain, fever, shortness of breath. She is under a lot of stress and has felt very anxious due to her recent health problems.   She's undergone evaluation for palpitations in 2010 and was once managed on metoprolol. This was discontinued after one month of taking given no identified cause for palpitations other than anxiety.   Review of Systems  Constitutional: Negative for chills and fever.  HENT: Positive for postnasal drip. Negative for congestion.   Respiratory: Positive for cough. Negative for shortness of breath.   Cardiovascular: Positive for palpitations. Negative for chest pain.  Allergic/Immunologic: Positive for environmental allergies.  Psychiatric/Behavioral: The patient is nervous/anxious.        Past Medical History:  Diagnosis Date  . GERD (gastroesophageal reflux disease)   . Nephrolithiasis 1/14   non obstructing--on CT by Dr Gustavo Lah     Social History     Social History  . Marital status: Married    Spouse name: N/A  . Number of children: 4  . Years of education: N/A   Occupational History  . teacher, elementary school    Social History Main Topics  . Smoking status: Never Smoker  . Smokeless tobacco: Never Used  . Alcohol use No     Comment: occ  . Drug use: No  . Sexual activity: Not on file   Other Topics Concern  . Not on file   Social History Narrative  . No narrative on file    Past Surgical History:  Procedure Laterality Date  . ESOPHAGOGASTRODUODENOSCOPY  08/99   normal  . VAGINAL DELIVERY     x4    Family History  Problem Relation Age of Onset  . Diabetes Neg Hx   . Heart disease Neg Hx   . Hypertension Neg Hx   . Ataxia Neg Hx   . Chorea Neg Hx   . Dementia Neg Hx   . Mental retardation Neg Hx   . Migraines Neg Hx   . Multiple sclerosis Neg Hx   . Neurofibromatosis Neg Hx   . Neuropathy Neg Hx   . Parkinsonism Neg Hx   . Seizures Neg Hx   . Stroke Neg Hx     No Known Allergies  Current Outpatient Prescriptions on File Prior to Visit  Medication Sig Dispense Refill  . cyanocobalamin 500 MCG tablet Take 500 mcg by mouth  daily.    . predniSONE (DELTASONE) 20 MG tablet Take 40 mg by mouth daily with breakfast.      No current facility-administered medications on file prior to visit.     BP (!) 170/100   Pulse (!) 114   Temp 98.4 F (36.9 C) (Oral)   Ht 5\' 9"  (1.753 m)   Wt 156 lb (70.8 kg)   SpO2 99%   BMI 23.04 kg/m    Objective:   Physical Exam  Constitutional: She appears well-nourished.  Neck: Neck supple. No thyromegaly present.  Cardiovascular: Regular rhythm.   Sinus tachycardia  Pulmonary/Chest: Effort normal and breath sounds normal.  Skin: Skin is warm and dry.  Psychiatric:  Does appear anxious today          Assessment & Plan:

## 2017-02-19 NOTE — Assessment & Plan Note (Addendum)
Intermittent palpitations for years. Work up years ago completed and according to patient work up was unremarkable. Exam today with sinus tachycardia. ECG: with Sinus tachycardia, no t-wave inversion, ST abnormality, heart blocks. Suspect palpitations related to anxiety, but will rule out any other metabolic cause. Check TSH, BMP, CBC. Discussed anxiety as a cause and discussed treatment options. Will have her discuss this in greater detail with PCP. Could also very well be related to chronic prednisone use.  Start low dose Lopressor BID for high BP and tachycardia, she agrees.  Will have her follow up with PCP in 2 weeks with BP readings.

## 2017-03-02 ENCOUNTER — Encounter: Payer: Self-pay | Admitting: Internal Medicine

## 2017-03-02 ENCOUNTER — Ambulatory Visit (INDEPENDENT_AMBULATORY_CARE_PROVIDER_SITE_OTHER): Payer: BC Managed Care – PPO | Admitting: Internal Medicine

## 2017-03-02 VITALS — BP 148/86 | HR 116 | Temp 97.8°F | Wt 154.0 lb

## 2017-03-02 DIAGNOSIS — R000 Tachycardia, unspecified: Secondary | ICD-10-CM

## 2017-03-02 DIAGNOSIS — R002 Palpitations: Secondary | ICD-10-CM

## 2017-03-02 NOTE — Progress Notes (Signed)
   Subjective:    Patient ID: Jenny Rangel, female    DOB: Jun 10, 1961, 56 y.o.   MRN: [id number]  HPI Here for follow up of palpitations Also other concerns  The palpitations are better More noticeable today--- but more anxious Checking her BP and pulse daily----- 109/75-147/89, pulses 62-91 Gets sensation of "jumping" No chest pain--but feels tight by "diaphragm"----points by xiphoid Will get "involuntary breath" while sleeping and it will awaken her (and occasionally at night)  Still on decreased prednisone doses for her left eye Vision still not normal  Current Outpatient Prescriptions on File Prior to Visit  Medication Sig Dispense Refill  . cyanocobalamin 500 MCG tablet Take 500 mcg by mouth daily.    . metoprolol tartrate (LOPRESSOR) 25 MG tablet Take 1 tablet (25 mg total) by mouth 2 (two) times daily. 60 tablet 0  . predniSONE (DELTASONE) 20 MG tablet Take 10 mg by mouth daily with breakfast.      No current facility-administered medications on file prior to visit.     No Known Allergies  Past Medical History:  Diagnosis Date  . GERD (gastroesophageal reflux disease)   . Nephrolithiasis 1/14   non obstructing--on CT by Dr Gustavo Lah    Past Surgical History:  Procedure Laterality Date  . ESOPHAGOGASTRODUODENOSCOPY  08/99   normal  . VAGINAL DELIVERY     x4    Family History  Problem Relation Age of Onset  . Diabetes Neg Hx   . Heart disease Neg Hx   . Hypertension Neg Hx   . Ataxia Neg Hx   . Chorea Neg Hx   . Dementia Neg Hx   . Mental retardation Neg Hx   . Migraines Neg Hx   . Multiple sclerosis Neg Hx   . Neurofibromatosis Neg Hx   . Neuropathy Neg Hx   . Parkinsonism Neg Hx   . Seizures Neg Hx   . Stroke Neg Hx     Social History   Social History  . Marital status: Married    Spouse name: N/A  . Number of children: 4  . Years of education: N/A   Occupational History  . teacher, elementary school    Social History Main Topics  .  Smoking status: Never Smoker  . Smokeless tobacco: Never Used  . Alcohol use No     Comment: occ  . Drug use: No  . Sexual activity: Not on file   Other Topics Concern  . Not on file   Social History Narrative  . No narrative on file   Review of Systems Recent cold and cough--- but thinks the cough could be caused by the heart Gets sense of "catching my breath" at times Has basically decided to proceed with surgery to remove thymoma    Objective:   Physical Exam  Cardiovascular: Normal rate, regular rhythm and normal heart sounds.  Exam reveals no gallop.   No murmur heard. Pulmonary/Chest: Effort normal and breath sounds normal. No respiratory distress. She has no wheezes. She has no rales.  Musculoskeletal: She exhibits no edema.  Psychiatric:  Anxious appearing          Assessment & Plan:

## 2017-03-02 NOTE — Assessment & Plan Note (Signed)
Did have past cardiac evaluation (?Dr Humphrey Rolls) Echo reportedly okay in 2011 Will try to get those results

## 2017-03-02 NOTE — Assessment & Plan Note (Signed)
Better on the metoprolol Will continue for now (probably at least till after thymectomy)

## 2017-03-06 ENCOUNTER — Ambulatory Visit: Payer: BC Managed Care – PPO | Admitting: Internal Medicine

## 2017-03-16 ENCOUNTER — Other Ambulatory Visit: Payer: Self-pay

## 2017-03-16 DIAGNOSIS — R002 Palpitations: Secondary | ICD-10-CM

## 2017-03-16 MED ORDER — METOPROLOL TARTRATE 25 MG PO TABS: 25.0000 mg | ORAL_TABLET | Freq: Two times a day (BID) | ORAL | 1 refills | Status: DC

## 2017-03-16 NOTE — Telephone Encounter (Signed)
Pt request refill metoprolol to [facility] [address]. Pt spilled some of med; pt has appt with doctor about thymectomy; nothing scheduled at this time. Refilled per protocol. Pt seen 03/02/17. Pt voiced understanding.

## 2017-04-06 ENCOUNTER — Telehealth: Payer: Self-pay

## 2017-04-06 NOTE — Telephone Encounter (Signed)
Spoke to pt about her recent thyroid procedure. She said she was a little sore but doing okay. She went through a spell of chills but no fever and fatigue.

## 2017-04-07 ENCOUNTER — Encounter: Payer: Self-pay | Admitting: Internal Medicine

## 2017-04-07 NOTE — Telephone Encounter (Signed)
Pt left v/m with same information left in email and pt request cb [phone].

## 2017-04-22 ENCOUNTER — Telehealth: Payer: Self-pay

## 2017-04-22 NOTE — Telephone Encounter (Addendum)
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 Pt request cb with what this is and why she was not told she had it. 2) Pt also has been taking home BP which average 110-120/70. 3 weeks ago pt decreased the metoprolol tartrate 25 mg to 1/2 tab bid. Med list has pt taking 1 tab bid. Pt wants to wein off med and wants to know if Dr Silvio Pate will say OK to stop metoprolol. Pt having no symptoms; no H/A, dizziness, CP or SOB.  3) also pt said on CT of chest there was a recommendation that pt have f/u MRI due to 2 small nodules on adrenal gland. Surgeon advised pt to ck with PCP about this. [place] Pt request cb. 4) pt also wants to know if Dr Silvio Pate got sonogram from cardiologist. Pt last seen 03/02/17.

## 2017-04-22 NOTE — Telephone Encounter (Signed)
Patient returned Shannon's call. °

## 2017-04-22 NOTE — Telephone Encounter (Signed)
Please let her know that I am happy the thymus mass was just a cyst. The EKG finding of right atrial enlargement is not clear cut--and has limited significance even if it is correct. No action is needed about this. The metoprolol was mainly for her palpitations. If these are better, okay to wean off it (try the 1/2 twice a day for a week and then can stop if no problems). The CT findings in the adrenal glands are almost certainly an incidental finding. Given what she has gone through, it may be reasonable to check an MRI. I think it is best to hold off till her October visit to consider this. I don't see an ultrasound from the cardiologist in the records here or at the other places. That would certainly be more definitive about her right atrial size though

## 2017-04-22 NOTE — Telephone Encounter (Signed)
Spoke to pt. She just saw Dr Chancy Milroy today. We should be getting the results.

## 2017-04-22 NOTE — Telephone Encounter (Signed)
Left message to call office

## 2017-04-22 NOTE — Telephone Encounter (Signed)
Pt returned shannon's call

## 2017-05-14 ENCOUNTER — Ambulatory Visit (INDEPENDENT_AMBULATORY_CARE_PROVIDER_SITE_OTHER): Payer: BC Managed Care – PPO | Admitting: Internal Medicine

## 2017-05-14 ENCOUNTER — Encounter: Payer: Self-pay | Admitting: Internal Medicine

## 2017-05-14 VITALS — BP 126/72 | HR 100 | Temp 97.5°F | Ht 69.0 in | Wt 163.8 lb

## 2017-05-14 DIAGNOSIS — R5383 Other fatigue: Secondary | ICD-10-CM

## 2017-05-14 DIAGNOSIS — E279 Disorder of adrenal gland, unspecified: Secondary | ICD-10-CM

## 2017-05-14 DIAGNOSIS — E278 Other specified disorders of adrenal gland: Secondary | ICD-10-CM

## 2017-05-14 NOTE — Progress Notes (Signed)
Subjective:    Patient ID: Jenny Rangel, female    DOB: 11-23-1960, 56 y.o.   MRN: [id number]  HPI She is here due to fatigue and not feeling well  Noted sweats that started before she had the surgery for the thymus Lots of fatigue Feels feverish, tired, eye drainage--- but no fever Seemed some better after the surgery (6 weeks ago) Travelled to [place] for vacation--then back to work in the past week  Then 5 days ago, got really achy Throbbing ache in arms and legs Feels some better today Worse at night  Tylenol and ibuprofen not helping the aching It is still affecting her sleep--barely sleeps 2 hours at times Left over oxycodone did seem to help Able to put up with the pain in day  Current Outpatient Prescriptions on File Prior to Visit  Medication Sig Dispense Refill  . cyanocobalamin 500 MCG tablet Take 500 mcg by mouth daily.    . metoprolol tartrate (LOPRESSOR) 25 MG tablet Take 1 tablet (25 mg total) by mouth 2 (two) times daily. (Patient taking differently: Take 12.5 mg by mouth daily. ) 60 tablet 1   No current facility-administered medications on file prior to visit.     No Known Allergies  Past Medical History:  Diagnosis Date  . GERD (gastroesophageal reflux disease)   . Nephrolithiasis 1/14   non obstructing--on CT by Dr Gustavo Lah    Past Surgical History:  Procedure Laterality Date  . ESOPHAGOGASTRODUODENOSCOPY  08/99   normal  . VAGINAL DELIVERY     x4    Family History  Problem Relation Age of Onset  . Diabetes Neg Hx   . Heart disease Neg Hx   . Hypertension Neg Hx   . Ataxia Neg Hx   . Chorea Neg Hx   . Dementia Neg Hx   . Mental retardation Neg Hx   . Migraines Neg Hx   . Multiple sclerosis Neg Hx   . Neurofibromatosis Neg Hx   . Neuropathy Neg Hx   . Parkinsonism Neg Hx   . Seizures Neg Hx   . Stroke Neg Hx     Social History   Social History  . Marital status: Married    Spouse name: N/A  . Number of children: 4  . Years  of education: N/A   Occupational History  . teacher, elementary school    Social History Main Topics  . Smoking status: Never Smoker  . Smokeless tobacco: Never Used  . Alcohol use No     Comment: occ  . Drug use: No  . Sexual activity: Not on file   Other Topics Concern  . Not on file   Social History Narrative  . No narrative on file   Review of Systems  No known tick bites Some mosquito bites but nothing striking Appetite is fine     Objective:   Physical Exam  Constitutional: She appears well-nourished. No distress.  HENT:  Mouth/Throat: Oropharynx is clear and moist. No oropharyngeal exudate.  Neck: No thyromegaly present.  Cardiovascular: Normal rate, regular rhythm and normal heart sounds.  Exam reveals no gallop.   No murmur heard. Pulmonary/Chest: Effort normal and breath sounds normal. No respiratory distress. She has no wheezes. She has no rales.  Abdominal: Soft. She exhibits no distension. There is no tenderness. There is no rebound and no guarding.  Musculoskeletal:  No joint swelling or tenderness  Lymphadenopathy:    She has no cervical adenopathy.    She  has no axillary adenopathy.       Right: No inguinal adenopathy present.       Left: No inguinal adenopathy present.  Skin: No rash noted.  Psychiatric: She has a normal mood and affect. Her behavior is normal.          Assessment & Plan:

## 2017-05-14 NOTE — Assessment & Plan Note (Signed)
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

## 2017-05-14 NOTE — Assessment & Plan Note (Signed)
Since she doesn't feel well, will follow up with MRI recommended by radiologist from [facility]

## 2017-05-15 ENCOUNTER — Ambulatory Visit: Payer: BC Managed Care – PPO | Admitting: Primary Care

## 2017-05-15 ENCOUNTER — Telehealth: Payer: Self-pay

## 2017-05-15 ENCOUNTER — Ambulatory Visit: Payer: BC Managed Care – PPO | Admitting: Internal Medicine

## 2017-05-15 MED ORDER — GABAPENTIN 300 MG PO CAPS: ORAL_CAPSULE | ORAL | 1 refills | Status: DC

## 2017-05-15 NOTE — Telephone Encounter (Signed)
Pt left v/m wanting to know if could get gabapentin for pain rather than the oxycodone; last night the oxycodone did not help pain and pt had trouble sleeping. Pt request cb. [facility] [address]. Pt seen 05/14/17.

## 2017-05-15 NOTE — Telephone Encounter (Signed)
Left detailed message on Cell Vm per DPR. Sent in rx.

## 2017-05-15 NOTE — Telephone Encounter (Signed)
I am not sure that the gabapentin will help but okay to try 300mg  #60 x 1 1-2 tabs at bedtime

## 2017-05-16 ENCOUNTER — Other Ambulatory Visit: Payer: Self-pay | Admitting: Internal Medicine

## 2017-05-16 DIAGNOSIS — E278 Other specified disorders of adrenal gland: Secondary | ICD-10-CM

## 2017-05-18 ENCOUNTER — Telehealth: Payer: Self-pay | Admitting: Internal Medicine

## 2017-05-18 NOTE — Telephone Encounter (Signed)
Okay I canceled the test No radiation in MRI but certainly can be expensive

## 2017-05-18 NOTE — Telephone Encounter (Signed)
Called patient to schedule the MRI. She asked me to ask you to cancel the order for the MRI. She said she has had a lot of radiation lately and also medical bills piling up so she would like to cancel at this time. She will call you back if she decides that she wants to have the [place]. Please cancel your order.

## 2017-05-19 ENCOUNTER — Encounter: Payer: Self-pay | Admitting: Internal Medicine

## 2017-06-22 ENCOUNTER — Ambulatory Visit (INDEPENDENT_AMBULATORY_CARE_PROVIDER_SITE_OTHER): Payer: BC Managed Care – PPO | Admitting: Internal Medicine

## 2017-06-22 ENCOUNTER — Encounter: Payer: Self-pay | Admitting: Internal Medicine

## 2017-06-22 ENCOUNTER — Telehealth: Payer: Self-pay | Admitting: Internal Medicine

## 2017-06-22 VITALS — BP 140/78 | HR 94 | Temp 97.4°F | Wt 159.0 lb

## 2017-06-22 DIAGNOSIS — E279 Disorder of adrenal gland, unspecified: Secondary | ICD-10-CM

## 2017-06-22 DIAGNOSIS — E278 Other specified disorders of adrenal gland: Secondary | ICD-10-CM

## 2017-06-22 DIAGNOSIS — R5383 Other fatigue: Secondary | ICD-10-CM | POA: Diagnosis not present

## 2017-06-22 NOTE — Assessment & Plan Note (Signed)
Better now Still has distinct symptoms of sweat then limited fatigue Overall, close to back to normal

## 2017-06-22 NOTE — Progress Notes (Signed)
   Subjective:    Patient ID: Jenny Rangel, female    DOB: Jun 09, 1961, 56 y.o.   MRN: [id number]  HPI Here for follow up of her fatigue  Overall, feeling better Just occasional day where her energy is low, but mostly feels beter Still will get a sweat, and then feel tired Gets burning in her eyes as well No diarrhea No dizziness  Still has some menopausal hot flashes--but mostly gone Didn't really have a sweat with these  Current Outpatient Prescriptions on File Prior to Visit  Medication Sig Dispense Refill  . Cholecalciferol (VITAMIN D3 PO) Take by mouth.    . cyanocobalamin 500 MCG tablet Take 500 mcg by mouth daily.    . Probiotic Product (PROBIOTIC DAILY PO) Take by mouth.     No current facility-administered medications on file prior to visit.     No Known Allergies  Past Medical History:  Diagnosis Date  . GERD (gastroesophageal reflux disease)   . Nephrolithiasis 1/14   non obstructing--on CT by Dr Gustavo Lah    Past Surgical History:  Procedure Laterality Date  . ESOPHAGOGASTRODUODENOSCOPY  08/99   normal  . VAGINAL DELIVERY     x4    Family History  Problem Relation Age of Onset  . Diabetes Neg Hx   . Heart disease Neg Hx   . Hypertension Neg Hx   . Ataxia Neg Hx   . Chorea Neg Hx   . Dementia Neg Hx   . Mental retardation Neg Hx   . Migraines Neg Hx   . Multiple sclerosis Neg Hx   . Neurofibromatosis Neg Hx   . Neuropathy Neg Hx   . Parkinsonism Neg Hx   . Seizures Neg Hx   . Stroke Neg Hx     Social History   Social History  . Marital status: Married    Spouse name: N/A  . Number of children: 4  . Years of education: N/A   Occupational History  . teacher, elementary school    Social History Main Topics  . Smoking status: Never Smoker  . Smokeless tobacco: Never Used  . Alcohol use No     Comment: occ  . Drug use: No  . Sexual activity: Not on file   Other Topics Concern  . Not on file   Social History Narrative  . No  narrative on file   Review of Systems Sleeping better Appetite is fine Weight is stable--trying to lose some weight     Objective:   Physical Exam  Constitutional: No distress.  Psychiatric: She has a normal mood and affect. Her behavior is normal.          Assessment & Plan:

## 2017-06-22 NOTE — Telephone Encounter (Signed)
Patient wants to know what the CPT codes are for the CT scan and the MRI that you may possible be ordering for her. She wants them both to call around to check on prices to compare [facility] pricies and [facility]. Please have Larene Beach call her back with these codes.

## 2017-06-22 NOTE — Assessment & Plan Note (Signed)
2 right adrenal nodules. Both under 2cm and smooth. Discussed recommendation to recheck these--- MRI vs CT scan She is not sure Asked her to check if she has met deductible---as rechecking would be a good idea (but really seems to be incidental)

## 2017-06-22 NOTE — Patient Instructions (Signed)
Let me know about whether you want to proceed with rechecking the adrenal nodules.

## 2017-06-23 NOTE — Telephone Encounter (Signed)
Per [facility] Dept, the Ct would be a CT Abdomen with and without contrast-Adrenal Protocol listed in comments in your order, CPT 74170. Per [facility], the MRI would be a MRI Abdomen with and without contrast-Ardrenal Protocol listed in your order, CPT [phone]. They still recommend that you call a Radiologist at [phone] to ask what is the best test for the patient before ordering test.

## 2017-06-23 NOTE — Telephone Encounter (Signed)
I don't appear to have access to CPT codes--not listed. The CT scan is [id number] in our computer The MRI I am not sure about--but probably is [id number]

## 2017-06-23 NOTE — Telephone Encounter (Signed)
The radiologist said either in the prior report. She just needs the CPT codes to call around for prices

## 2017-06-24 NOTE — Telephone Encounter (Signed)
Called the patient and LVM with the CPT codes

## 2017-07-02 ENCOUNTER — Telehealth: Payer: Self-pay | Admitting: *Deleted

## 2017-07-02 ENCOUNTER — Ambulatory Visit: Payer: BC Managed Care – PPO | Admitting: Internal Medicine

## 2017-07-02 DIAGNOSIS — E279 Disorder of adrenal gland, unspecified: Principal | ICD-10-CM

## 2017-07-02 DIAGNOSIS — E278 Other specified disorders of adrenal gland: Secondary | ICD-10-CM

## 2017-07-02 NOTE — Telephone Encounter (Signed)
Left message to call office

## 2017-07-02 NOTE — Telephone Encounter (Signed)
Spoke to pt who states she was recently advised she has a "few" nodules on her adrenal gland. She is now wanting to proceed with MRI or CT and wanted Dr Alla German input on how to proceed. pls advise

## 2017-07-02 NOTE — Telephone Encounter (Signed)
I can order either the MRI or CT scan where ever works best for her. Last I heard she was checking about her deductible and the costs at different locations

## 2017-07-03 NOTE — Telephone Encounter (Signed)
Spoke to pt. She said to go ahead and do the CT Abdomen.

## 2017-07-09 ENCOUNTER — Ambulatory Visit (INDEPENDENT_AMBULATORY_CARE_PROVIDER_SITE_OTHER)
Admission: RE | Admit: 2017-07-09 | Discharge: 2017-07-09 | Disposition: A | Discharge status: Home / Self Care | Payer: BC Managed Care – PPO | Source: Ambulatory Visit | Attending: Internal Medicine | Admitting: Internal Medicine

## 2017-07-09 ENCOUNTER — Other Ambulatory Visit: Payer: Self-pay | Admitting: Internal Medicine

## 2017-07-09 DIAGNOSIS — E279 Disorder of adrenal gland, unspecified: Secondary | ICD-10-CM

## 2017-07-09 DIAGNOSIS — E278 Other specified disorders of adrenal gland: Secondary | ICD-10-CM

## 2017-07-15 ENCOUNTER — Telehealth: Payer: Self-pay

## 2017-07-16 ENCOUNTER — Telehealth: Payer: Self-pay

## 2017-07-16 NOTE — Telephone Encounter (Signed)
Results had been sent on MyChart so I thought she got them. Unlikely the sweats are from hormonal output of the adenoma, but we can check appropriate studies Called her but had to leave message

## 2017-07-16 NOTE — Telephone Encounter (Signed)
Spoke to pt. She did not have an xray, she had a CT Scan. She said she has periodic sweats. Could that be coming from the adenomas?

## 2017-07-16 NOTE — Telephone Encounter (Signed)
In regards to what?

## 2017-07-16 NOTE — Telephone Encounter (Signed)
Copied from [place] [id number]. Topic: General - Other >> Jul 16, 2017  9:23 AM Ahmed Prima L wrote: Reason for CRM: Patient is wanting a call back ASAP for her xray results.

## 2017-07-16 NOTE — Telephone Encounter (Signed)
Discussed the results were reassuring that there is no cancer. She did get the report but wanted to hear from me personally.  Discussed hormone testing for routine----and considering surgery if it is putting out hormones. She now feels better. Her sweats are only occasional (and are unlikely to be from sex hormone excess, but could be cortisol). Glucose has not been elevated (last was not fasting I don't think). She would like to hold off on hormone testing for now. If any concerns, will do cortisol, ACTH, 24 hr urinary free cortisol, DHEAS, androstenedione, testosterone, estradiol and 17 hydroxyprogesterone. Would consider dexamenthasone suppression test also (but probably not needed if cortisol levels not elevated)  Discussed repeat scan. Not sure this will need to be done since stable over 6 months.

## 2017-11-12 ENCOUNTER — Encounter: Payer: Self-pay | Admitting: Internal Medicine

## 2017-11-12 ENCOUNTER — Ambulatory Visit: Payer: BC Managed Care – PPO | Admitting: Internal Medicine

## 2017-11-12 VITALS — BP 140/74 | HR 124 | Temp 97.6°F | Resp 12 | Wt 155.0 lb

## 2017-11-12 DIAGNOSIS — J0100 Acute maxillary sinusitis, unspecified: Secondary | ICD-10-CM | POA: Diagnosis not present

## 2017-11-12 MED ORDER — DOXYCYCLINE HYCLATE 100 MG PO TABS
100.0000 mg | ORAL_TABLET | Freq: Two times a day (BID) | ORAL | 0 refills | Status: DC
Start: 2017-11-12 — End: 2018-03-26

## 2017-11-12 NOTE — Assessment & Plan Note (Signed)
May still be viral or atypical--but suggestive of secondary bacterial infection Discussed supportive care If worsens, start the antibiotic

## 2017-11-12 NOTE — Progress Notes (Signed)
Subjective:    Patient ID: Jenny Rangel, female    DOB: [birth year], 57 y.o.   MRN: [id number]  HPI Here due to cough  Started about 10 days ago with tickle in throat Then cough and congestion Slight sinus headache throughout Some post nasal drip Seemed to be improving--but now worse again in past few days Congestion and cough---?sinus vs chest Symptoms still variable day to day Somewhat feverish again in the past couple of days Some chills Still some tickle in throat Not really short of breath--but harder to do steps No ear pain  Tried tylenol when had the chills  Current Outpatient Medications on File Prior to Visit  Medication Sig Dispense Refill  . Cholecalciferol (VITAMIN D3 PO) Take by mouth.    . cyanocobalamin 500 MCG tablet Take 500 mcg by mouth daily.    . pantoprazole (PROTONIX) 40 MG tablet Take by mouth.    . Probiotic Product (PROBIOTIC DAILY PO) Take by mouth.     No current facility-administered medications on file prior to visit.     No Known Allergies  Past Medical History:  Diagnosis Date  . GERD (gastroesophageal reflux disease)   . Nephrolithiasis 1/14   non obstructing--on CT by Dr Gustavo Lah    Past Surgical History:  Procedure Laterality Date  . ESOPHAGOGASTRODUODENOSCOPY  08/99   normal  . VAGINAL DELIVERY     x4    Family History  Problem Relation Age of Onset  . Diabetes Neg Hx   . Heart disease Neg Hx   . Hypertension Neg Hx   . Ataxia Neg Hx   . Chorea Neg Hx   . Dementia Neg Hx   . Mental retardation Neg Hx   . Migraines Neg Hx   . Multiple sclerosis Neg Hx   . Neurofibromatosis Neg Hx   . Neuropathy Neg Hx   . Parkinsonism Neg Hx   . Seizures Neg Hx   . Stroke Neg Hx     Social History   Socioeconomic History  . Marital status: Married    Spouse name: Not on file  . Number of children: 4  . Years of education: Not on file  . Highest education level: Not on file  Social Needs  . Financial resource strain: Not on  file  . Food insecurity - worry: Not on file  . Food insecurity - inability: Not on file  . Transportation needs - medical: Not on file  . Transportation needs - non-medical: Not on file  Occupational History  . Occupation: Pharmacist, hospital, elementary school  Tobacco Use  . Smoking status: Never Smoker  . Smokeless tobacco: Never Used  Substance and Sexual Activity  . Alcohol use: No    Comment: occ  . Drug use: No  . Sexual activity: Not on file  Other Topics Concern  . Not on file  Social History Narrative  . Not on file   Review of Systems No rash No vomiting or diarrhea Voice off a bit Appetite is okay    Objective:   Physical Exam  Constitutional: She appears well-developed. No distress.  HENT:  No sinus tenderness TMs normal Moderate pharyngeal injection without tonsillar enlargement or exudate Mild nasal inflammation  Neck: No thyromegaly present.  Pulmonary/Chest: Effort normal and breath sounds normal. No respiratory distress. She has no wheezes. She has no rales.  Lymphadenopathy:    She has no cervical adenopathy.          Assessment & Plan:

## 2017-11-12 NOTE — Patient Instructions (Signed)
Start the antibiotic if your worsen over the next few days.

## 2017-12-21 ENCOUNTER — Encounter: Payer: BC Managed Care – PPO | Admitting: Internal Medicine

## 2018-02-16 ENCOUNTER — Telehealth: Payer: Self-pay

## 2018-02-16 NOTE — Telephone Encounter (Signed)
Was unable to reach pt by phone to find out why she needed dermatology referral.

## 2018-02-16 NOTE — Telephone Encounter (Signed)
Copied from [place] [id number]. Topic: Referral - Request >> Feb 16, 2018  4:47 PM Antonieta Iba C wrote: Reason for CRM: pt is requesting a referral to a dermatologist. Pt would like to have referral to Dr Sallye Lat with [facility]- [phone]

## 2018-02-17 NOTE — Telephone Encounter (Signed)
Left a detailed message on vm per PDR to let us know why she needs to see them.

## 2018-02-17 NOTE — Telephone Encounter (Signed)
She wants a 2nd opinion for a hair loss and scalp issue she is having. She is currently seeing someone at [facility]. She tried to make her own appt but they require a PCP referral.

## 2018-02-18 ENCOUNTER — Other Ambulatory Visit: Payer: Self-pay | Admitting: Internal Medicine

## 2018-02-18 DIAGNOSIS — L659 Nonscarring hair loss, unspecified: Secondary | ICD-10-CM

## 2018-02-18 NOTE — Telephone Encounter (Signed)
Left detailed message on VM per DPR. 

## 2018-02-18 NOTE — Telephone Encounter (Signed)
Let her know that consult placed She should be hearing about this soon

## 2018-03-11 NOTE — Telephone Encounter (Signed)
Dermatology Referral was faxed to [facility] Dr Sallye Lat on June 4th. Left detailed message on machine for patient that referral was faxed and that after review they will call the patient directly. She can followup with them at [phone].

## 2018-03-11 NOTE — Telephone Encounter (Signed)
Called [facility] back  And the patient has an Appt on 04/08/18 at 11:15am with Dr Hart Carwin and the appt was just made today, 03/11/18

## 2018-03-11 NOTE — Telephone Encounter (Signed)
Patient called to find out the status of her referral to a dermatologist.  She still has not heard from the dermatologist.  CB# [phone].

## 2018-03-26 ENCOUNTER — Ambulatory Visit: Payer: BC Managed Care – PPO | Admitting: Internal Medicine

## 2018-03-26 ENCOUNTER — Encounter: Payer: BC Managed Care – PPO | Admitting: Internal Medicine

## 2018-03-26 ENCOUNTER — Ambulatory Visit (INDEPENDENT_AMBULATORY_CARE_PROVIDER_SITE_OTHER): Payer: BC Managed Care – PPO | Admitting: Internal Medicine

## 2018-03-26 ENCOUNTER — Encounter: Payer: Self-pay | Admitting: Internal Medicine

## 2018-03-26 VITALS — BP 140/82 | HR 108 | Temp 98.0°F | Ht 69.0 in | Wt 147.0 lb

## 2018-03-26 DIAGNOSIS — Z0000 Encounter for general adult medical examination without abnormal findings: Secondary | ICD-10-CM

## 2018-03-26 DIAGNOSIS — Z23 Encounter for immunization: Secondary | ICD-10-CM | POA: Diagnosis not present

## 2018-03-26 DIAGNOSIS — E278 Other specified disorders of adrenal gland: Secondary | ICD-10-CM

## 2018-03-26 DIAGNOSIS — K219 Gastro-esophageal reflux disease without esophagitis: Secondary | ICD-10-CM

## 2018-03-26 DIAGNOSIS — E279 Disorder of adrenal gland, unspecified: Secondary | ICD-10-CM | POA: Diagnosis not present

## 2018-03-26 LAB — LIPID PANEL
CHOL/HDL RATIO: 3
Cholesterol: 177 mg/dL (ref 0–200)
HDL: 63.1 mg/dL (ref >39.00)
LDL CALC: 100 mg/dL — AB (ref 0–99)
NONHDL: 113.48
TRIGLYCERIDES: 65 mg/dL (ref 0.0–149.0)
VLDL: 13 mg/dL (ref 0.0–40.0)

## 2018-03-26 LAB — COMPREHENSIVE METABOLIC PANEL
ALT: 8 U/L (ref 0–35)
AST: 19 U/L (ref 0–37)
Albumin: 4.4 g/dL (ref 3.5–5.2)
Alkaline Phosphatase: 104 U/L (ref 39–117)
BILIRUBIN TOTAL: 0.3 mg/dL (ref 0.2–1.2)
BUN: 8 mg/dL (ref 6–23)
CALCIUM: 9.7 mg/dL (ref 8.4–10.5)
CHLORIDE: 107 meq/L (ref 96–112)
CO2: 27 meq/L (ref 19–32)
Creatinine, Ser: 0.71 mg/dL (ref 0.40–1.20)
GFR: 90.99 mL/min (ref >60.00)
GLUCOSE: 106 mg/dL — AB (ref 70–99)
Potassium: 5.3 mEq/L — ABNORMAL HIGH (ref 3.5–5.1)
Sodium: 141 mEq/L (ref 135–145)
Total Protein: 7.3 g/dL (ref 6.0–8.3)

## 2018-03-26 LAB — CBC
HCT: 38.8 % (ref 36.0–46.0)
Hemoglobin: 12.8 g/dL (ref 12.0–15.0)
MCHC: 32.9 g/dL (ref 30.0–36.0)
MCV: 91.1 fl (ref 78.0–100.0)
PLATELETS: 231 10*3/uL (ref 150.0–400.0)
RBC: 4.26 Mil/uL (ref 3.87–5.11)
RDW: 13.6 % (ref 11.5–15.5)
WBC: 4.5 10*3/uL (ref 4.0–10.5)

## 2018-03-26 NOTE — Assessment & Plan Note (Signed)
Probably needs regular PPI---?every 2nd or 3rd day

## 2018-03-26 NOTE — Progress Notes (Signed)
Subjective:    Patient ID: Jenny Rangel, female    DOB: 1961-05-02, 57 y.o.   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 Cholecalciferol (VITAMIN D3 PO) Take by mouth.    . cyanocobalamin 500 MCG tablet Take 500 mcg by mouth daily.    . Probiotic Product (PROBIOTIC DAILY PO) Take by mouth.    . ranitidine (ZANTAC) 150 MG tablet Take 150 mg by mouth daily.     No current facility-administered medications on file prior to visit.     No Known Allergies  Past Medical History:  Diagnosis Date  . GERD (gastroesophageal reflux disease)   . Nephrolithiasis 1/14   non obstructing--on CT by Dr Gustavo Lah    Past Surgical History:  Procedure Laterality Date  . ESOPHAGOGASTRODUODENOSCOPY  08/99   normal  . VAGINAL DELIVERY     x4    Family History  Problem Relation Age of Onset  . Diabetes Neg Hx   . Heart disease Neg Hx   . Hypertension Neg Hx   . Ataxia Neg Hx   . Chorea Neg Hx   . Dementia Neg Hx   . Mental retardation Neg Hx   . Migraines Neg Hx   . Multiple sclerosis Neg Hx   . Neurofibromatosis Neg Hx   . Neuropathy Neg Hx   . Parkinsonism Neg Hx   . Seizures Neg Hx   . Stroke Neg Hx     Social History   Socioeconomic History  . Marital status: Married    Spouse name: Not on file  . Number of children: 4  . Years of education: Not on file  . Highest education level: Not on file  Occupational History  . Occupation: Pharmacist, hospital, elementary school  Social Needs  . Financial resource strain: Not on file  . Food insecurity:    Worry: Not on file    Inability: Not on file  .  Transportation needs:    Medical: Not on file    Non-medical: Not on file  Tobacco Use  . Smoking status: Never Smoker  . Smokeless tobacco: Never Used  Substance and Sexual Activity  . Alcohol use: No    Comment: occ  . Drug use: No  . Sexual activity: Not on file  Lifestyle  . Physical activity:    Days per week: Not on file    Minutes per session: Not on file  . Stress: Not on file  Relationships  . Social connections:    Talks on phone: Not on file    Gets together: Not on file    Attends religious service: Not on file    Active member of club or organization: Not on file    Attends meetings of clubs or organizations: Not on file    Relationship status: Not on file  . Intimate partner violence:    Fear of current or ex partner: Not on file    Emotionally abused: Not on file    Physically abused: Not on file    Forced sexual activity: Not on file  Other Topics Concern  . Not on file  Social History Narrative  . Not on file   Review of Systems  Constitutional: Negative for fatigue and unexpected weight change.       Weight is down some Exercising regularly Wears seat belt  HENT: Negative for hearing loss, sinus pressure, sinus pain and tinnitus.        Some post nasal drip Keeps up with dentist  Eyes: Negative for visual disturbance.       No diplopia or unilateral vision loss  Respiratory: Negative for cough, chest tightness and shortness of breath.   Cardiovascular: Negative for chest pain and leg swelling.       Rare skip in heart Rare edema if on feet all day in the heat--better in AM  Gastrointestinal: Negative for blood in stool and constipation.  Endocrine: Negative for polydipsia and polyuria.  Genitourinary: Negative for difficulty urinating, dyspareunia, dysuria and urgency.  Musculoskeletal: Negative for arthralgias, back pain and joint swelling.       Achy at times  Skin:       Rash in hairline Going to a new derm soon  Allergic/Immunologic:  Negative for environmental allergies and immunocompromised state.  Neurological: Negative for dizziness, syncope, light-headedness and headaches.  Hematological: Negative for adenopathy. Does not bruise/bleed easily.  Psychiatric/Behavioral: Negative for dysphoric mood and sleep disturbance. The patient is not nervous/anxious.        Occ nighttime awakening       Objective:   Physical Exam  Constitutional: She is oriented to person, place, and time. She appears well-developed. No distress.  HENT:  Head: Normocephalic and atraumatic.  Right Ear: External ear normal.  Left Ear: External ear normal.  Mouth/Throat: Oropharynx is clear and moist. No oropharyngeal exudate.  Eyes: Pupils are equal, round, and reactive to light. Conjunctivae are normal.  Neck: No thyromegaly present.  Cardiovascular: Normal rate, regular rhythm, normal heart sounds and intact distal pulses. Exam reveals no gallop.  No murmur heard. Respiratory: Effort normal and breath sounds normal. No respiratory distress. She has no wheezes. She has no rales.  GI: Soft. There is no tenderness.  Musculoskeletal: She exhibits no edema or tenderness.  Lymphadenopathy:    She has no cervical adenopathy.  Neurological: She is alert and oriented to person, place, and time.  Skin: No erythema.  Psychiatric: She has a normal mood and affect. Her behavior is normal.           Assessment & Plan:

## 2018-03-26 NOTE — Addendum Note (Signed)
Addended by: Pilar Grammes on: 03/26/2018 12:46 PM   Modules accepted: Orders

## 2018-03-26 NOTE — Assessment & Plan Note (Signed)
No apparent hormonal effect

## 2018-03-26 NOTE — Assessment & Plan Note (Signed)
Healthy Exercising regularly Keeps up with paps at gyn Mammogram due in November Colon due 2026 Recommended yearly flu vaccine---she will consider

## 2018-04-01 ENCOUNTER — Telehealth: Payer: Self-pay

## 2018-04-01 NOTE — Telephone Encounter (Signed)
The results were mailed to her 03-29-18. Should be receiving them soon. Spoke to pt.

## 2018-04-01 NOTE — Telephone Encounter (Signed)
Copied from [place] [id number]. Topic: Quick Communication - Lab Results >> Apr 01, 2018 12:03 PM Lennox Solders wrote: Pt would like blood work results from last 75

## 2018-06-22 ENCOUNTER — Ambulatory Visit: Payer: BC Managed Care – PPO | Admitting: Internal Medicine

## 2018-06-22 ENCOUNTER — Encounter: Payer: Self-pay | Admitting: Internal Medicine

## 2018-06-22 VITALS — BP 140/88 | HR 92 | Temp 97.8°F | Ht 69.0 in | Wt 150.0 lb

## 2018-06-22 DIAGNOSIS — R449 Unspecified symptoms and signs involving general sensations and perceptions: Secondary | ICD-10-CM

## 2018-06-22 NOTE — Progress Notes (Signed)
Subjective:    Patient ID: Jenny Rangel, female    DOB: 1961/02/05, 57 y.o.   MRN: [id number]  HPI Here due to issues with her mouth and saliva Mouth will get dry at times Gets sensation in jaw--- "my salivary glands are extra active" Gets sensation like you would get with lemon Goes along with plugged feeling in ears Goes back 1 month  No pain with chewing--some dull ache in TMJs at times May relieve the sensation of the saliva glands if she opens her mouth big Did go to dentist--panoramic x-ray was okay Some grinding at night---wears retainer No swelling in jaw  Current Outpatient Medications on File Prior to Visit  Medication Sig Dispense Refill  . Cholecalciferol (VITAMIN D3 PO) Take by mouth.    . cyanocobalamin 500 MCG tablet Take 500 mcg by mouth daily.    . Probiotic Product (PROBIOTIC DAILY PO) Take by mouth.    . pantoprazole (PROTONIX) 40 MG tablet Take 40 mg by mouth every other day.     No current facility-administered medications on file prior to visit.     No Known Allergies  Past Medical History:  Diagnosis Date  . GERD (gastroesophageal reflux disease)   . Nephrolithiasis 1/14   non obstructing--on CT by Dr Gustavo Lah    Past Surgical History:  Procedure Laterality Date  . ESOPHAGOGASTRODUODENOSCOPY  08/99   normal  . VAGINAL DELIVERY     x4    Family History  Problem Relation Age of Onset  . Diabetes Neg Hx   . Heart disease Neg Hx   . Hypertension Neg Hx   . Ataxia Neg Hx   . Chorea Neg Hx   . Dementia Neg Hx   . Mental retardation Neg Hx   . Migraines Neg Hx   . Multiple sclerosis Neg Hx   . Neurofibromatosis Neg Hx   . Neuropathy Neg Hx   . Parkinsonism Neg Hx   . Seizures Neg Hx   . Stroke Neg Hx     Social History   Socioeconomic History  . Marital status: Married    Spouse name: Not on file  . Number of children: 4  . Years of education: Not on file  . Highest education level: Not on file  Occupational History  .  Occupation: Pharmacist, hospital, elementary school  Social Needs  . Financial resource strain: Not on file  . Food insecurity:    Worry: Not on file    Inability: Not on file  . Transportation needs:    Medical: Not on file    Non-medical: Not on file  Tobacco Use  . Smoking status: Never Smoker  . Smokeless tobacco: Never Used  Substance and Sexual Activity  . Alcohol use: No    Comment: occ  . Drug use: No  . Sexual activity: Not on file  Lifestyle  . Physical activity:    Days per week: Not on file    Minutes per session: Not on file  . Stress: Not on file  Relationships  . Social connections:    Talks on phone: Not on file    Gets together: Not on file    Attends religious service: Not on file    Active member of club or organization: Not on file    Attends meetings of clubs or organizations: Not on file    Relationship status: Not on file  . Intimate partner violence:    Fear of current or ex partner: Not on file  Emotionally abused: Not on file    Physically abused: Not on file    Forced sexual activity: Not on file  Other Topics Concern  . Not on file  Social History Narrative  . Not on file   Review of Systems Has intermittent heartburn issues--but stopped PPI a month ago (due to dry mouth) Sensation started before this No fever No recent air travel or in mountains    Objective:   Physical Exam  HENT:   No oral lesions Pharynx with slight injection but not really inflamed. Small cyst on right tonsillar pillar TMJs are hypermobile but no dislocation or tenderness TMs normal  Neck: No thyromegaly present.  No salivary gland enlargement  Lymphadenopathy:    She has no cervical adenopathy.           Assessment & Plan:

## 2018-06-22 NOTE — Assessment & Plan Note (Signed)
No clear pathology Reassured Not clear if stopping the PPI could be related---might be worth trying it for a week again Next step would be ENT evaluation--but not clear that is needed

## 2018-06-23 ENCOUNTER — Telehealth: Payer: Self-pay | Admitting: *Deleted

## 2018-06-23 DIAGNOSIS — K1370 Unspecified lesions of oral mucosa: Secondary | ICD-10-CM

## 2018-06-23 NOTE — Telephone Encounter (Signed)
Copied from [place] [id number]. Topic: Inquiry >> Jun 23, 2018  2:13 PM Conception Chancy, NT wrote: Reason for CRM: patient states that in her visit yesterday she was told by Dr. Silvio Pate that there was one cyst in her mouth and she states she noticed more today. She would like a nurse to call her.

## 2018-06-23 NOTE — Telephone Encounter (Signed)
I saw 1 tiny (like51mm) apparent cyst on her right tonsillar pillar. If she has more, and is worried about it, I am okay with her going to ENT----she can probably make her own appt but I can make referral if that would help. I doubt any action is needed, even if she has more lesions (if they are like the one I saw)

## 2018-06-23 NOTE — Telephone Encounter (Signed)
Patient called in and stated she would like the referral placed by office

## 2018-06-23 NOTE — Telephone Encounter (Signed)
Detailed message left on VM per DPR. 

## 2018-06-23 NOTE — Telephone Encounter (Signed)
Copied from [place] [id number]. Topic: Referral - Request >> Jun 23, 2018  2:12 PM Conception Chancy, NT wrote: Reason for CRM: patient is calling and was seen in the office on 06/22/18 and is requesting a referral to a ENT.

## 2018-06-24 NOTE — Addendum Note (Signed)
Addended by: Viviana Simpler I on: 06/24/2018 08:32 AM   Modules accepted: Orders

## 2018-06-25 NOTE — Telephone Encounter (Signed)
Spoke with the patient and sent Referral to [facility] ENT, patient is aware.

## 2018-06-29 ENCOUNTER — Ambulatory Visit: Payer: BC Managed Care – PPO | Admitting: Internal Medicine

## 2018-07-12 ENCOUNTER — Other Ambulatory Visit: Payer: Self-pay | Admitting: Obstetrics and Gynecology

## 2018-07-12 DIAGNOSIS — Z1231 Encounter for screening mammogram for malignant neoplasm of breast: Secondary | ICD-10-CM

## 2018-08-03 ENCOUNTER — Ambulatory Visit
Admission: RE | Admit: 2018-08-03 | Discharge: 2018-08-03 | Disposition: A | Discharge status: Home / Self Care | Payer: BC Managed Care – PPO | Source: Ambulatory Visit | Attending: Obstetrics and Gynecology | Admitting: Obstetrics and Gynecology

## 2018-08-03 DIAGNOSIS — Z1231 Encounter for screening mammogram for malignant neoplasm of breast: Secondary | ICD-10-CM

## 2018-08-06 ENCOUNTER — Other Ambulatory Visit: Payer: Self-pay | Admitting: Obstetrics and Gynecology

## 2018-08-06 DIAGNOSIS — N6489 Other specified disorders of breast: Secondary | ICD-10-CM

## 2018-08-06 DIAGNOSIS — R928 Other abnormal and inconclusive findings on diagnostic imaging of breast: Secondary | ICD-10-CM

## 2018-08-16 ENCOUNTER — Ambulatory Visit
Admission: RE | Admit: 2018-08-16 | Discharge: 2018-08-16 | Disposition: A | Discharge status: Home / Self Care | Payer: BC Managed Care – PPO | Source: Ambulatory Visit | Attending: Obstetrics and Gynecology | Admitting: Obstetrics and Gynecology

## 2018-08-16 DIAGNOSIS — R928 Other abnormal and inconclusive findings on diagnostic imaging of breast: Secondary | ICD-10-CM

## 2018-08-16 DIAGNOSIS — N6489 Other specified disorders of breast: Secondary | ICD-10-CM | POA: Diagnosis present

## 2018-08-23 ENCOUNTER — Ambulatory Visit: Payer: BC Managed Care – PPO

## 2018-08-23 ENCOUNTER — Other Ambulatory Visit: Payer: BC Managed Care – PPO

## 2019-02-25 ENCOUNTER — Ambulatory Visit: Payer: BC Managed Care – PPO | Admitting: Internal Medicine

## 2019-02-25 ENCOUNTER — Other Ambulatory Visit: Payer: Self-pay

## 2019-02-25 ENCOUNTER — Encounter: Payer: Self-pay | Admitting: Internal Medicine

## 2019-02-25 VITALS — BP 138/78 | HR 97 | Temp 97.9°F | Wt 150.0 lb

## 2019-02-25 DIAGNOSIS — M79672 Pain in left foot: Secondary | ICD-10-CM

## 2019-02-25 NOTE — Patient Instructions (Signed)
If you have persistent foot or knee pain, call for an appointment with Dr Lorelei Pont.

## 2019-02-25 NOTE — Assessment & Plan Note (Signed)
Seems to be mechanical---likely related to change in gait after right knee injury Gait is fairly normal now Discussed foot wear and slow resumption of running If persistent problems, will set up with Dr Lorelei Pont

## 2019-02-25 NOTE — Progress Notes (Signed)
Subjective:    Patient ID: Jenny Rangel, female    DOB: Jan 21, 1961, 58 y.o.   MRN: [id number]  HPI Here due to right knee and left foot pain  Fell while running about 3 months ago Directly onto right knee on sidewalk--pretty bad fall and broke phone Walked home--some cuts Significant swelling later that day Iced on and off for a week---took ibuprofen a few times Has been an extended recovery Is back to some running---until her left ankle pain started Mostly still just numb than painful  Left foot has been painful along lateral midfoot---pain and soreness--for about a month Hadn't stopped her from walking or running Awoke once about a month ago and noted swelling Did ice Pain with walking---this seems better Feels that the bone (5th metatarsal) is protruding Pain radiates up past the lateral malleolus and into lower calf Still slightly swollen  Current Outpatient Medications on File Prior to Visit  Medication Sig Dispense Refill  . Cholecalciferol (VITAMIN D3 PO) Take by mouth.    . cyanocobalamin 500 MCG tablet Take 500 mcg by mouth daily.    . pantoprazole (PROTONIX) 40 MG tablet Take 40 mg by mouth every other day.    . Probiotic Product (PROBIOTIC DAILY PO) Take by mouth.     No current facility-administered medications on file prior to visit.     No Known Allergies  Past Medical History:  Diagnosis Date  . GERD (gastroesophageal reflux disease)   . Nephrolithiasis 1/14   non obstructing--on CT by Dr Gustavo Lah    Past Surgical History:  Procedure Laterality Date  . ESOPHAGOGASTRODUODENOSCOPY  08/99   normal  . VAGINAL DELIVERY     x4    Family History  Problem Relation Age of Onset  . Diabetes Neg Hx   . Heart disease Neg Hx   . Hypertension Neg Hx   . Ataxia Neg Hx   . Chorea Neg Hx   . Dementia Neg Hx   . Mental retardation Neg Hx   . Migraines Neg Hx   . Multiple sclerosis Neg Hx   . Neurofibromatosis Neg Hx   . Neuropathy Neg Hx   .  Parkinsonism Neg Hx   . Seizures Neg Hx   . Stroke Neg Hx   . Breast cancer Neg Hx     Social History   Socioeconomic History  . Marital status: Married    Spouse name: Not on file  . Number of children: 4  . Years of education: Not on file  . Highest education level: Not on file  Occupational History  . Occupation: Pharmacist, hospital, elementary school  Social Needs  . Financial resource strain: Not on file  . Food insecurity:    Worry: Not on file    Inability: Not on file  . Transportation needs:    Medical: Not on file    Non-medical: Not on file  Tobacco Use  . Smoking status: Never Smoker  . Smokeless tobacco: Never Used  Substance and Sexual Activity  . Alcohol use: No    Comment: occ  . Drug use: No  . Sexual activity: Not on file  Lifestyle  . Physical activity:    Days per week: Not on file    Minutes per session: Not on file  . Stress: Not on file  Relationships  . Social connections:    Talks on phone: Not on file    Gets together: Not on file    Attends religious service: Not on file  Active member of club or organization: Not on file    Attends meetings of clubs or organizations: Not on file    Relationship status: Not on file  . Intimate partner violence:    Fear of current or ex partner: Not on file    Emotionally abused: Not on file    Physically abused: Not on file    Forced sexual activity: Not on file  Other Topics Concern  . Not on file  Social History Narrative  . Not on file   Review of Systems No fever No other joint issues Wears Reeboks when walking/running    Objective:   Physical Exam  Constitutional: She appears well-developed. No distress.  Musculoskeletal:     Comments: Normal ROM in hips No right knee swelling. No meniscus or ligament findings. Patellar tendon snap with extension (no symptoms with that)  Left foot without swelling No tenderness or bony findings Ankle is normal           Assessment & Plan:

## 2019-03-28 ENCOUNTER — Ambulatory Visit: Payer: BC Managed Care – PPO | Admitting: Internal Medicine

## 2019-03-28 ENCOUNTER — Ambulatory Visit (INDEPENDENT_AMBULATORY_CARE_PROVIDER_SITE_OTHER): Payer: BC Managed Care – PPO | Admitting: Internal Medicine

## 2019-03-28 ENCOUNTER — Encounter: Payer: Self-pay | Admitting: Internal Medicine

## 2019-03-28 VITALS — BP 128/80 | Ht 69.0 in | Wt 149.0 lb

## 2019-03-28 DIAGNOSIS — R5383 Other fatigue: Secondary | ICD-10-CM

## 2019-03-28 DIAGNOSIS — E279 Disorder of adrenal gland, unspecified: Secondary | ICD-10-CM

## 2019-03-28 DIAGNOSIS — E278 Other specified disorders of adrenal gland: Secondary | ICD-10-CM

## 2019-03-28 NOTE — Progress Notes (Signed)
Subjective:    Patient ID: Jenny Rangel, female    DOB: 1961/06/04, 58 y.o.   MRN: [id number]  HPI Virtual visit due to fatigue Identification done Reviewed billing and she gave consent She is at home and I am in my office  Has been feeling "really drained" Started around 10 days ago Reminds her of the time when she needed her surgery, etc No sweats now---but same tired feeling  Some achy feelings in arms and legs Not really central in hips/shoulders No fever No cough, SOB or other respiratory symptoms No joint swelling or pain No apparent enlarged lymph nodes  Home since schools closed Usually does summer school but not this year Tries to do stuff around her house and sees some friends Had to cancel yearly visit to [facility] (her family is there) Not really depressed Was able to run this morning--but still has the feeling (usual pace)  Current Outpatient Medications on File Prior to Visit  Medication Sig Dispense Refill  . Cholecalciferol (VITAMIN D3 PO) Take by mouth.    . cyanocobalamin 500 MCG tablet Take 500 mcg by mouth daily.    . pantoprazole (PROTONIX) 40 MG tablet Take 40 mg by mouth every other day.    . Probiotic Product (PROBIOTIC DAILY PO) Take by mouth.     No current facility-administered medications on file prior to visit.     No Known Allergies  Past Medical History:  Diagnosis Date  . GERD (gastroesophageal reflux disease)   . Nephrolithiasis 1/14   non obstructing--on CT by Dr Gustavo Lah    Past Surgical History:  Procedure Laterality Date  . ESOPHAGOGASTRODUODENOSCOPY  08/99   normal  . VAGINAL DELIVERY     x4    Family History  Problem Relation Age of Onset  . Diabetes Neg Hx   . Heart disease Neg Hx   . Hypertension Neg Hx   . Ataxia Neg Hx   . Chorea Neg Hx   . Dementia Neg Hx   . Mental retardation Neg Hx   . Migraines Neg Hx   . Multiple sclerosis Neg Hx   . Neurofibromatosis Neg Hx   . Neuropathy Neg Hx   . Parkinsonism Neg  Hx   . Seizures Neg Hx   . Stroke Neg Hx   . Breast cancer Neg Hx     Social History   Socioeconomic History  . Marital status: Married    Spouse name: Not on file  . Number of children: 4  . Years of education: Not on file  . Highest education level: Not on file  Occupational History  . Occupation: Pharmacist, hospital, elementary school  Social Needs  . Financial resource strain: Not on file  . Food insecurity    Worry: Not on file    Inability: Not on file  . Transportation needs    Medical: Not on file    Non-medical: Not on file  Tobacco Use  . Smoking status: Never Smoker  . Smokeless tobacco: Never Used  Substance and Sexual Activity  . Alcohol use: No    Comment: occ  . Drug use: No  . Sexual activity: Not on file  Lifestyle  . Physical activity    Days per week: Not on file    Minutes per session: Not on file  . Stress: Not on file  Relationships  . Social Herbalist on phone: Not on file    Gets together: Not on file  Attends religious service: Not on file    Active member of club or organization: Not on file    Attends meetings of clubs or organizations: Not on file    Relationship status: Not on file  . Intimate partner violence    Fear of current or ex partner: Not on file    Emotionally abused: Not on file    Physically abused: Not on file    Forced sexual activity: Not on file  Other Topics Concern  . Not on file  Social History Narrative  . Not on file   Review of Systems Appetite is fine Weight stable Sleeping okay--still groggy in AM ("and I don't fully wake up") No abdominal pain    Objective:   Physical Exam  Constitutional: She appears well-developed. No distress.  Respiratory: Effort normal. No respiratory distress.  Psychiatric: She has a normal mood and affect. Her behavior is normal.           Assessment & Plan:

## 2019-03-28 NOTE — Assessment & Plan Note (Signed)
No specific physical symptoms She wondered about COVID---nothing to suggest that Will check labs She is planning trip to beach--that may help her symptoms (but discussed social distancing)

## 2019-03-28 NOTE — Assessment & Plan Note (Signed)
Will add cortisol to the blood work

## 2019-03-29 ENCOUNTER — Other Ambulatory Visit (INDEPENDENT_AMBULATORY_CARE_PROVIDER_SITE_OTHER): Payer: BC Managed Care – PPO

## 2019-03-29 ENCOUNTER — Other Ambulatory Visit: Payer: Self-pay

## 2019-03-29 DIAGNOSIS — E279 Disorder of adrenal gland, unspecified: Secondary | ICD-10-CM

## 2019-03-29 DIAGNOSIS — E278 Other specified disorders of adrenal gland: Secondary | ICD-10-CM

## 2019-03-29 DIAGNOSIS — R5383 Other fatigue: Secondary | ICD-10-CM | POA: Diagnosis not present

## 2019-03-29 LAB — CORTISOL: Cortisol, Plasma: 18.7 ug/dL

## 2019-03-29 LAB — CBC
HCT: 37.3 % (ref 36.0–46.0)
Hemoglobin: 12.6 g/dL (ref 12.0–15.0)
MCHC: 33.7 g/dL (ref 30.0–36.0)
MCV: 91.7 fl (ref 78.0–100.0)
Platelets: 223 10*3/uL (ref 150.0–400.0)
RBC: 4.07 Mil/uL (ref 3.87–5.11)
RDW: 13.5 % (ref 11.5–15.5)
WBC: 5.3 10*3/uL (ref 4.0–10.5)

## 2019-03-29 LAB — HEPATIC FUNCTION PANEL
ALT: 7 U/L (ref 0–35)
AST: 14 U/L (ref 0–37)
Albumin: 4.3 g/dL (ref 3.5–5.2)
Alkaline Phosphatase: 106 U/L (ref 39–117)
Bilirubin, Direct: 0.1 mg/dL (ref 0.0–0.3)
Total Bilirubin: 0.7 mg/dL (ref 0.2–1.2)
Total Protein: 6.8 g/dL (ref 6.0–8.3)

## 2019-03-29 LAB — RENAL FUNCTION PANEL
Albumin: 4.3 g/dL (ref 3.5–5.2)
BUN: 10 mg/dL (ref 6–23)
CO2: 28 mEq/L (ref 19–32)
Calcium: 9.1 mg/dL (ref 8.4–10.5)
Chloride: 106 mEq/L (ref 96–112)
Creatinine, Ser: 0.79 mg/dL (ref 0.40–1.20)
GFR: 75.4 mL/min (ref >60.00)
Glucose, Bld: 96 mg/dL (ref 70–99)
Phosphorus: 3.9 mg/dL (ref 2.3–4.6)
Potassium: 4.9 mEq/L (ref 3.5–5.1)
Sodium: 140 mEq/L (ref 135–145)

## 2019-03-29 LAB — VITAMIN B12: Vitamin B-12: 1176 pg/mL — ABNORMAL HIGH (ref 211–911)

## 2019-03-29 LAB — T4, FREE: Free T4: 0.72 ng/dL (ref 0.60–1.60)

## 2019-03-31 ENCOUNTER — Telehealth: Payer: Self-pay | Admitting: Internal Medicine

## 2019-03-31 NOTE — Telephone Encounter (Signed)
Loma Sousa spoke to her and realized it was the labs. Read the result note.

## 2019-03-31 NOTE — Telephone Encounter (Signed)
Patient stated she was returning a call from the office. She did not receive a V/M and was not sure what the call was in regards to .   C/B # [phone]

## 2019-04-25 ENCOUNTER — Other Ambulatory Visit: Payer: Self-pay

## 2019-04-25 ENCOUNTER — Telehealth: Payer: Self-pay | Admitting: Internal Medicine

## 2019-04-25 ENCOUNTER — Encounter: Payer: Self-pay | Admitting: Family Medicine

## 2019-04-25 ENCOUNTER — Ambulatory Visit (INDEPENDENT_AMBULATORY_CARE_PROVIDER_SITE_OTHER): Payer: BC Managed Care – PPO | Admitting: Family Medicine

## 2019-04-25 VITALS — BP 140/80 | HR 95 | Temp 98.1°F | Ht 69.0 in | Wt 154.5 lb

## 2019-04-25 DIAGNOSIS — S9032XA Contusion of left foot, initial encounter: Secondary | ICD-10-CM

## 2019-04-25 NOTE — Telephone Encounter (Signed)
Patient scheduled appointment with Dr.Copland today at 3:40.

## 2019-04-25 NOTE — Telephone Encounter (Signed)
Patient called today and stated that she is having some bruising and swelling in her foot. Patient stated at her last visit it was mention that she may want to schedule an appointment with Dr Lorelei Pont  She wanted to see what you suggest before she schedules an appointment and wanted to know if you think it is best to go ahead and see Copland or make a visit to come in to see you first   C/B # [phone]

## 2019-04-25 NOTE — Progress Notes (Signed)
Jenny Rangel T. Jenny Heatley, MD [facility] and [place] [facility] [place], [address] Phone: [phone]  FAX: [place] - 58 y.o. female  MRN [id number]  Date of Birth: 1961/04/26  Visit Date: 04/25/2019  PCP: Venia Carbon, MD  Referred by: Venia Carbon, MD  Chief Complaint  Patient presents with  . Foot Pain    with bruising   Subjective:   Jenny Rangel is a 58 y.o. very pleasant female patient with Body mass index is 22.82 kg/m. who presents with the following:  Took a sventeen mile bike ride and then ran the next day.   She has developed some bruising around her first digit and around her first metatarsal, but there is no pain.  There was initially some slight amount of pain.  This resolved very quickly.  She is not limited by pain now and she is not limping.  Past Medical History, Surgical History, Social History, Family History, Problem List, Medications, and Allergies have been reviewed and updated if relevant.  Patient Active Problem List   Diagnosis Date Noted  . Left foot pain 02/25/2019  . Abnormal mouth sensation 06/22/2018  . Preventative health care 03/26/2018  . Fatigue 05/14/2017  . 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 Dizziness 04/20/2013  . Elevated blood pressure reading without diagnosis of hypertension 04/20/2013  . GERD 01/17/2010    Past Medical History:  Diagnosis Date  . GERD (gastroesophageal reflux disease)   . Nephrolithiasis 1/14   non obstructing--on CT by Dr Gustavo Lah    Past Surgical History:  Procedure Laterality Date  . ESOPHAGOGASTRODUODENOSCOPY  08/99   normal  . VAGINAL DELIVERY     x4    Social History   Socioeconomic History  . Marital status: Married    Spouse name: Not on file  . Number of children: 4  . Years of education: Not on file  . Highest education level: Not on file  Occupational History  . Occupation: Pharmacist, hospital, elementary  school  Social Needs  . Financial resource strain: Not on file  . Food insecurity    Worry: Not on file    Inability: Not on file  . Transportation needs    Medical: Not on file    Non-medical: Not on file  Tobacco Use  . Smoking status: Never Smoker  . Smokeless tobacco: Never Used  Substance and Sexual Activity  . Alcohol use: No    Comment: occ  . Drug use: No  . Sexual activity: Not on file  Lifestyle  . Physical activity    Days per week: Not on file    Minutes per session: Not on file  . Stress: Not on file  Relationships  . Social Herbalist on phone: Not on file    Gets together: Not on file    Attends religious service: Not on file    Active member of club or organization: Not on file    Attends meetings of clubs or organizations: Not on file    Relationship status: Not on file  . Intimate partner violence    Fear of current or ex partner: Not on file    Emotionally abused: Not on file    Physically abused: Not on file    Forced sexual activity: Not on file  Other Topics Concern  . Not on file  Social History Narrative  . Not on file  Family History  Problem Relation Age of Onset  . Diabetes Neg Hx   . Heart disease Neg Hx   . Hypertension Neg Hx   . Ataxia Neg Hx   . Chorea Neg Hx   . Dementia Neg Hx   . Mental retardation Neg Hx   . Migraines Neg Hx   . Multiple sclerosis Neg Hx   . Neurofibromatosis Neg Hx   . Neuropathy Neg Hx   . Parkinsonism Neg Hx   . Seizures Neg Hx   . Stroke Neg Hx   . Breast cancer Neg Hx     No Known Allergies  Medication list reviewed and updated in full in [place].  GEN: No fevers, chills. Nontoxic. Primarily MSK c/o today. MSK: Detailed in the HPI GI: tolerating PO intake without difficulty Neuro: No numbness, parasthesias, or tingling associated. Otherwise the pertinent positives of the ROS are noted above.   Objective:   BP 140/80   Pulse 95   Temp 98.1 F (36.7 C) (Temporal)    Ht 5\' 9"  (1.753 m)   Wt 154 lb 8 oz (70.1 kg)   SpO2 98%   BMI 22.82 kg/m    GEN: WDWN, NAD, Non-toxic, Alert & Oriented x 3 HEENT: Atraumatic, Normocephalic.  Ears and Nose: No external deformity. EXTR: No clubbing/cyanosis/edema NEURO: Normal gait.  PSYCH: Normally interactive. Conversant. Not depressed or anxious appearing.  Calm demeanor.    The entirety of the foot and ankle exam is entirely normal with the exception of some bruising at the first ray as well as the first metatarsal.  Movement is normal, bony palpation is normal and nontender  Radiology: Results for orders placed or performed in visit on 03/29/19  CBC  Result Value Ref Range   WBC 5.3 4.0 - 10.5 K/uL   RBC 4.07 3.87 - 5.11 Mil/uL   Platelets 223.0 150.0 - 400.0 K/uL   Hemoglobin 12.6 12.0 - 15.0 g/dL   HCT 37.3 36.0 - 46.0 %   MCV 91.7 78.0 - 100.0 fl   MCHC 33.7 30.0 - 36.0 g/dL   RDW 13.5 11.5 - 15.5 %  Vitamin B12  Result Value Ref Range   Vitamin B-12 1,176 (H) 211 - 911 pg/mL  Renal function panel  Result Value Ref Range   Sodium 140 135 - 145 mEq/L   Potassium 4.9 3.5 - 5.1 mEq/L   Chloride 106 96 - 112 mEq/L   CO2 28 19 - 32 mEq/L   Calcium 9.1 8.4 - 10.5 mg/dL   Albumin 4.3 3.5 - 5.2 g/dL   BUN 10 6 - 23 mg/dL   Creatinine, Ser 0.79 0.40 - 1.20 mg/dL   Glucose, Bld 96 70 - 99 mg/dL   Phosphorus 3.9 2.3 - 4.6 mg/dL   GFR 75.40 >60.00 mL/min  Hepatic function panel  Result Value Ref Range   Total Bilirubin 0.7 0.2 - 1.2 mg/dL   Bilirubin, Direct 0.1 0.0 - 0.3 mg/dL   Alkaline Phosphatase 106 39 - 117 U/L   AST 14 0 - 37 U/L   ALT 7 0 - 35 U/L   Total Protein 6.8 6.0 - 8.3 g/dL   Albumin 4.3 3.5 - 5.2 g/dL  T4, free  Result Value Ref Range   Free T4 0.72 0.60 - 1.60 ng/dL  Cortisol  Result Value Ref Range   Cortisol, Plasma 18.7 ug/dL     Assessment and Plan:     ICD-10-CM   1. Contusion of left foot,  initial encounter  S90.32XA    Not overly concerned.  Attempted to reassure  the patient.  Likely from impact.  CBC and liver function are normal.  Follow-up: No follow-ups on file.  No orders of the defined types were placed in this encounter.  No orders of the defined types were placed in this encounter.   Signed,  Maud Deed. Dahir Ayer, MD   Outpatient Encounter Medications as of 04/25/2019  Medication Sig  . Cholecalciferol (VITAMIN D3 PO) Take by mouth.  . cyanocobalamin 500 MCG tablet Take 500 mcg by mouth daily.  . pantoprazole (PROTONIX) 40 MG tablet Take 40 mg by mouth every other day.  . Probiotic Product (PROBIOTIC DAILY PO) Take by mouth.   No facility-administered encounter medications on file as of 04/25/2019.

## 2019-04-25 NOTE — Telephone Encounter (Signed)
I think it would be best to set right up with Dr Maudry Diego this has been going on for a while

## 2019-04-26 ENCOUNTER — Telehealth: Payer: Self-pay | Admitting: *Deleted

## 2019-04-26 NOTE — Telephone Encounter (Signed)
Please call:  As long as she has no pain, running is fine.  Callouses would not make any difference.   I would be extremely reassured.  There is nothing on her exam that would make me limit anything at all.

## 2019-04-26 NOTE — Telephone Encounter (Signed)
Patient called stating that she was seen yesterday and woke up this morning and realized the top of her left foot between her big toe and second toe is swollen. Patient stated that she is not sure if it was like that yesterday when she saw Dr. Lorelei Pont or not. Patient wants to know if she should start back running with the swelling present? Patient also forgot to ask if Dr. Lorelei Pont thinks the calloses on the bottom of her foot had anything to do with her injury?

## 2019-04-26 NOTE — Telephone Encounter (Signed)
Ms. Busk notified as instructed by telephone.

## 2019-07-18 ENCOUNTER — Ambulatory Visit: Payer: BC Managed Care – PPO | Admitting: Internal Medicine

## 2019-09-26 ENCOUNTER — Ambulatory Visit: Payer: BC Managed Care – PPO | Admitting: Internal Medicine

## 2019-10-13 ENCOUNTER — Ambulatory Visit: Payer: BC Managed Care – PPO | Admitting: Internal Medicine

## 2019-10-19 ENCOUNTER — Telehealth: Payer: Self-pay

## 2019-10-19 NOTE — Telephone Encounter (Signed)
Pantoprazole is listed in patient's chart

## 2019-10-19 NOTE — Telephone Encounter (Signed)
Pt wants to know if Dr Silvio Pate would prescribe a PPI for pt; pt has gotten in past with GI. Please advise.

## 2019-10-20 MED ORDER — PANTOPRAZOLE SODIUM 40 MG PO TBEC: 40.0000 mg | DELAYED_RELEASE_TABLET | Freq: Every day | ORAL | 3 refills | Status: DC

## 2019-10-20 NOTE — Telephone Encounter (Signed)
Prescription refilled.

## 2019-10-20 NOTE — Telephone Encounter (Signed)
Okay to refill the pantoprazole for a year

## 2019-10-25 ENCOUNTER — Other Ambulatory Visit: Payer: Self-pay | Admitting: Obstetrics and Gynecology

## 2019-10-25 DIAGNOSIS — Z1231 Encounter for screening mammogram for malignant neoplasm of breast: Secondary | ICD-10-CM

## 2019-11-22 ENCOUNTER — Ambulatory Visit
Admission: RE | Admit: 2019-11-22 | Discharge: 2019-11-22 | Disposition: A | Discharge status: Home / Self Care | Payer: BC Managed Care – PPO | Source: Ambulatory Visit | Attending: Obstetrics and Gynecology | Admitting: Obstetrics and Gynecology

## 2019-11-22 DIAGNOSIS — Z1231 Encounter for screening mammogram for malignant neoplasm of breast: Secondary | ICD-10-CM | POA: Diagnosis present

## 2019-12-12 ENCOUNTER — Ambulatory Visit: Payer: BC Managed Care – PPO | Admitting: Internal Medicine

## 2019-12-15 ENCOUNTER — Other Ambulatory Visit: Payer: BC Managed Care – PPO

## 2019-12-20 ENCOUNTER — Telehealth: Payer: Self-pay | Admitting: Internal Medicine

## 2019-12-20 NOTE — Telephone Encounter (Signed)
Left message on Vm per DPR. Will check on her again on Thursday.

## 2019-12-20 NOTE — Telephone Encounter (Signed)
Let her know that I am glad she is feeling better. Sometimes it takes a while to get completely over it. Please check on her over the next few days to make sure she continues to improve.  I would also still recommend the vaccine despite her having the illness. She should wait till she feels back to normal---and it may be worth waiting 2-3 months before getting it

## 2019-12-20 NOTE — Telephone Encounter (Signed)
Pt called and wanted to let you know she had covid  She was tested 3/23 @ [facility] with positive results 3/25.    She stated she did have fever headache lost of taste and smell and runny nose.  But she is feeling fine now.  Lost of taste and smell are coming back

## 2019-12-27 NOTE — Telephone Encounter (Signed)
Left message on VM to see how she is doing.

## 2020-02-21 ENCOUNTER — Telehealth: Payer: Self-pay | Admitting: *Deleted

## 2020-02-21 NOTE — Telephone Encounter (Signed)
Patient left a voicemail wanting to know if she can get a refill on her Pantoprazole.  Pantoprazole was refilled on 10/20/19 #90/3. Patient should have refills left at the pharmacy. Tried to call patient back to advise her of this. Had to leave a message on voicemail for patient to call back.

## 2020-02-21 NOTE — Telephone Encounter (Signed)
Patient called back  Advised of message below, stated she will call her pharmacy

## 2020-03-27 ENCOUNTER — Ambulatory Visit: Payer: BC Managed Care – PPO | Admitting: Internal Medicine

## 2020-03-27 ENCOUNTER — Encounter: Payer: Self-pay | Admitting: Internal Medicine

## 2020-03-27 ENCOUNTER — Other Ambulatory Visit: Payer: Self-pay

## 2020-03-27 VITALS — BP 140/76 | HR 96 | Temp 97.6°F | Ht 68.5 in | Wt 156.0 lb

## 2020-03-27 DIAGNOSIS — Z0000 Encounter for general adult medical examination without abnormal findings: Secondary | ICD-10-CM | POA: Diagnosis not present

## 2020-03-27 DIAGNOSIS — K2100 Gastro-esophageal reflux disease with esophagitis, without bleeding: Secondary | ICD-10-CM

## 2020-03-27 LAB — VITAMIN B12: Vitamin B-12: 586 pg/mL (ref 211–911)

## 2020-03-27 LAB — COMPREHENSIVE METABOLIC PANEL
ALT: 7 U/L (ref 0–35)
AST: 17 U/L (ref 0–37)
Albumin: 4.6 g/dL (ref 3.5–5.2)
Alkaline Phosphatase: 106 U/L (ref 39–117)
BUN: 12 mg/dL (ref 6–23)
CO2: 29 mEq/L (ref 19–32)
Calcium: 9.6 mg/dL (ref 8.4–10.5)
Chloride: 106 mEq/L (ref 96–112)
Creatinine, Ser: 0.76 mg/dL (ref 0.40–1.20)
GFR: 78.56 mL/min (ref >60.00)
Glucose, Bld: 101 mg/dL — ABNORMAL HIGH (ref 70–99)
Potassium: 4.9 mEq/L (ref 3.5–5.1)
Sodium: 142 mEq/L (ref 135–145)
Total Bilirubin: 0.6 mg/dL (ref 0.2–1.2)
Total Protein: 7.3 g/dL (ref 6.0–8.3)

## 2020-03-27 LAB — LIPID PANEL
Cholesterol: 197 mg/dL (ref 0–200)
HDL: 68.5 mg/dL (ref >39.00)
LDL Cholesterol: 113 mg/dL — ABNORMAL HIGH (ref 0–99)
NonHDL: 128.62
Total CHOL/HDL Ratio: 3
Triglycerides: 77 mg/dL (ref 0.0–149.0)
VLDL: 15.4 mg/dL (ref 0.0–40.0)

## 2020-03-27 LAB — CBC
HCT: 39.2 % (ref 36.0–46.0)
Hemoglobin: 13.2 g/dL (ref 12.0–15.0)
MCHC: 33.6 g/dL (ref 30.0–36.0)
MCV: 91.9 fl (ref 78.0–100.0)
Platelets: 232 10*3/uL (ref 150.0–400.0)
RBC: 4.27 Mil/uL (ref 3.87–5.11)
RDW: 13.4 % (ref 11.5–15.5)
WBC: 6 10*3/uL (ref 4.0–10.5)

## 2020-03-27 LAB — T4, FREE: Free T4: 0.85 ng/dL (ref 0.60–1.60)

## 2020-03-27 NOTE — Assessment & Plan Note (Signed)
Symptoms flare when she goes off PPI Discussed uninterrupted Rx ---and trying every other day if symptoms gone

## 2020-03-27 NOTE — Assessment & Plan Note (Signed)
Fairly healthy Still runs Colon due 2026 Mammogram recently--continue every 2-3 years Had COVID--wants to hold off on vaccine Prefers no flu vaccine

## 2020-03-27 NOTE — Progress Notes (Signed)
Subjective:    Patient ID: Jenny Rangel, female    DOB: 1961-06-28, 59 y.o.   MRN: [id number]  HPI Here for physical This visit occurred during the SARS-CoV-2 public health emergency.  Safety protocols were in place, including screening questions prior to the visit, additional usage of staff PPE, and extensive cleaning of exam room while observing appropriate contact time as indicated for disinfecting solutions.   Main concern is left hand seems to have lost muscle mass Pain along thumb--tendonitis? (for some years) Wonders about PT Did have injury to 5th finger which affected her also Is right hand dominant  Occasional throat irritation Now back on daily PPI--since she relates this to the reflux No dysphagia (just chronic throat symptoms) Does have elevated HOB  Continues on B12  Current Outpatient Medications on File Prior to Visit  Medication Sig Dispense Refill  . Cholecalciferol (VITAMIN D3 PO) Take by mouth.    . cyanocobalamin 500 MCG tablet Take 500 mcg by mouth daily.    . pantoprazole (PROTONIX) 40 MG tablet Take 1 tablet (40 mg total) by mouth daily. 90 tablet 3   No current facility-administered medications on file prior to visit.    No Known Allergies  Past Medical History:  Diagnosis Date  . GERD (gastroesophageal reflux disease)   . Nephrolithiasis 1/14   non obstructing--on CT by Dr Gustavo Lah    Past Surgical History:  Procedure Laterality Date  . ESOPHAGOGASTRODUODENOSCOPY  08/99   normal  . VAGINAL DELIVERY     x4    Family History  Problem Relation Age of Onset  . Diabetes Neg Hx   . Heart disease Neg Hx   . Hypertension Neg Hx   . Ataxia Neg Hx   . Chorea Neg Hx   . Dementia Neg Hx   . Mental retardation Neg Hx   . Migraines Neg Hx   . Multiple sclerosis Neg Hx   . Neurofibromatosis Neg Hx   . Neuropathy Neg Hx   . Parkinsonism Neg Hx   . Seizures Neg Hx   . Stroke Neg Hx   . Breast cancer Neg Hx     Social History    Socioeconomic History  . Marital status: Married    Spouse name: Not on file  . Number of children: 4  . Years of education: Not on file  . Highest education level: Not on file  Occupational History  . Occupation: Pharmacist, hospital, elementary school  Tobacco Use  . Smoking status: Never Smoker  . Smokeless tobacco: Never Used  Substance and Sexual Activity  . Alcohol use: No    Comment: occ  . Drug use: No  . Sexual activity: Not on file  Other Topics Concern  . Not on file  Social History Narrative  . Not on file   Social Determinants of Health   Financial Resource Strain:   . Difficulty of Paying Living Expenses:   Food Insecurity:   . Worried About Charity fundraiser in the Last Year:   . Arboriculturist in the Last Year:   Transportation Needs:   . Film/video editor (Medical):   Marland Kitchen Lack of Transportation (Non-Medical):   Physical Activity:   . Days of Exercise per Week:   . Minutes of Exercise per Session:   Stress:   . Feeling of Stress :   Social Connections:   . Frequency of Communication with Friends and Family:   . Frequency of Social Gatherings with Friends  and Family:   . Attends Religious Services:   . Active Member of Clubs or Organizations:   . Attends Archivist Meetings:   Marland Kitchen Marital Status:   Intimate Partner Violence:   . Fear of Current or Ex-Partner:   . Emotionally Abused:   Marland Kitchen Physically Abused:   . Sexually Abused:    Review of Systems  Constitutional: Negative for fatigue and unexpected weight change.       Wears seat belt Runs regularly  HENT: Negative for dental problem, hearing loss and tinnitus.        Keeps up with dentist  Eyes: Negative for visual disturbance.       No diplopia or unilateral vision loss  Respiratory: Negative for cough, chest tightness and shortness of breath.   Cardiovascular: Negative for chest pain and leg swelling.       Rare palpitation--brief  Gastrointestinal: Negative for abdominal pain and  blood in stool.       Occ mild constipation--- uses flax seed and citrucel  Endocrine: Negative for polydipsia and polyuria.  Genitourinary: Negative for dyspareunia, dysuria and hematuria.       Keeps up with gyn for paps  Musculoskeletal: Negative for arthralgias, back pain and joint swelling.  Skin: Negative for rash.       Sees derm  Allergic/Immunologic: Negative for environmental allergies and immunocompromised state.  Neurological: Negative for dizziness, syncope, light-headedness and headaches.  Hematological: Negative for adenopathy. Does not bruise/bleed easily.  Psychiatric/Behavioral: Negative for dysphoric mood and sleep disturbance. The patient is not nervous/anxious.        Objective:   Physical Exam Constitutional:      Appearance: Normal appearance.  HENT:     Head: Normocephalic and atraumatic.     Right Ear: Tympanic membrane and ear canal normal.     Left Ear: Tympanic membrane and ear canal normal.     Mouth/Throat:     Mouth: Mucous membranes are moist.     Comments: No oral lesions Eyes:     Conjunctiva/sclera: Conjunctivae normal.     Pupils: Pupils are equal, round, and reactive to light.  Cardiovascular:     Rate and Rhythm: Normal rate and regular rhythm.     Pulses: Normal pulses.     Heart sounds: No murmur heard.  No gallop.   Pulmonary:     Effort: Pulmonary effort is normal.     Breath sounds: Normal breath sounds. No wheezing or rales.  Abdominal:     Palpations: Abdomen is soft.     Tenderness: There is no abdominal tenderness.  Musculoskeletal:     Cervical back: Neck supple.     Right lower leg: No edema.     Left lower leg: No edema.  Lymphadenopathy:     Cervical: No cervical adenopathy.  Skin:    General: Skin is warm.     Findings: No rash.  Neurological:     General: No focal deficit present.     Mental Status: She is alert and oriented to person, place, and time.  Psychiatric:        Mood and Affect: Mood normal.         Behavior: Behavior normal.            Assessment & Plan:

## 2020-04-23 ENCOUNTER — Encounter: Payer: Self-pay | Admitting: Internal Medicine

## 2020-04-23 ENCOUNTER — Other Ambulatory Visit: Payer: Self-pay

## 2020-04-23 ENCOUNTER — Ambulatory Visit: Payer: BC Managed Care – PPO | Admitting: Internal Medicine

## 2020-04-23 DIAGNOSIS — G4452 New daily persistent headache (NDPH): Secondary | ICD-10-CM

## 2020-04-23 DIAGNOSIS — R519 Headache, unspecified: Secondary | ICD-10-CM | POA: Insufficient documentation

## 2020-04-23 NOTE — Progress Notes (Signed)
Subjective:    Patient ID: Jenny Rangel, female    DOB: [birth year], 59 y.o.   MRN: [id number]  HPI Here due to headaches This visit occurred during the SARS-CoV-2 public health emergency.  Safety protocols were in place, including screening questions prior to the visit, additional usage of staff PPE, and extensive cleaning of exam room while observing appropriate contact time as indicated for disinfecting solutions.   She generally only rarely gets headaches Started over 2 weeks ago Mainly on top of head---bilateral temporal/frontal Mild but then worsened---so tried tylenol and ibuprofen Also tried aleve with tylenol---this dulled the throbbing   Will have periods in the day without significant pain--but never totally feels gone Seems better in the evening--just sitting Worse at work--subbing at summer school  In the past 4 days or so, is much milder Has had headaches that are worse with moving her eyes Some frontal pressure--wondered about sinus (but no runny nose)  Did awaken with mild headache No nausea No aura Didn't miss work or leave  Some of the headaches have been more occipital---and she thought there might be a tension component  Current Outpatient Medications on File Prior to Visit  Medication Sig Dispense Refill  . Cholecalciferol (VITAMIN D3 PO) Take by mouth.    . cyanocobalamin 500 MCG tablet Take 500 mcg by mouth daily.    . pantoprazole (PROTONIX) 40 MG tablet Take 1 tablet (40 mg total) by mouth daily. 90 tablet 3   No current facility-administered medications on file prior to visit.    No Known Allergies  Past Medical History:  Diagnosis Date  . GERD (gastroesophageal reflux disease)   . Nephrolithiasis 1/14   non obstructing--on CT by Dr Gustavo Lah    Past Surgical History:  Procedure Laterality Date  . ESOPHAGOGASTRODUODENOSCOPY  08/99   normal  . VAGINAL DELIVERY     x4    Family History  Problem Relation Age of Onset  . Diabetes Neg Hx     . Heart disease Neg Hx   . Hypertension Neg Hx   . Ataxia Neg Hx   . Chorea Neg Hx   . Dementia Neg Hx   . Mental retardation Neg Hx   . Migraines Neg Hx   . Multiple sclerosis Neg Hx   . Neurofibromatosis Neg Hx   . Neuropathy Neg Hx   . Parkinsonism Neg Hx   . Seizures Neg Hx   . Stroke Neg Hx   . Breast cancer Neg Hx     Social History   Socioeconomic History  . Marital status: Married    Spouse name: Not on file  . Number of children: 4  . Years of education: Not on file  . Highest education level: Not on file  Occupational History  . Occupation: Pharmacist, hospital, elementary school  Tobacco Use  . Smoking status: Never Smoker  . Smokeless tobacco: Never Used  Substance and Sexual Activity  . Alcohol use: No    Comment: occ  . Drug use: No  . Sexual activity: Not on file  Other Topics Concern  . Not on file  Social History Narrative  . Not on file   Social Determinants of Health   Financial Resource Strain:   . Difficulty of Paying Living Expenses:   Food Insecurity:   . Worried About Charity fundraiser in the Last Year:   . Arboriculturist in the Last Year:   Transportation Needs:   . Lack of Transportation (  Medical):   Marland Kitchen Lack of Transportation (Non-Medical):   Physical Activity:   . Days of Exercise per Week:   . Minutes of Exercise per Session:   Stress:   . Feeling of Stress :   Social Connections:   . Frequency of Communication with Friends and Family:   . Frequency of Social Gatherings with Friends and Family:   . Attends Religious Services:   . Active Member of Clubs or Organizations:   . Attends Archivist Meetings:   Marland Kitchen Marital Status:   Intimate Partner Violence:   . Fear of Current or Ex-Partner:   . Emotionally Abused:   Marland Kitchen Physically Abused:   . Sexually Abused:    Review of Systems  No new exposures No history of migraines Has not been working outside---does run in the morning (4-5 times a week)     Objective:   Physical  Exam Constitutional:      Appearance: Normal appearance.  HENT:     Head:     Comments: No sinus tenderness (or temporal tenderness) Neurological:     Mental Status: She is alert and oriented to person, place, and time.     Cranial Nerves: Cranial nerves are intact.     Sensory: Sensation is intact.     Motor: Motor function is intact. No weakness, tremor or abnormal muscle tone.     Coordination: Coordination is intact. Romberg sign negative. Coordination normal. Finger-Nose-Finger Test normal.  Psychiatric:        Mood and Affect: Mood normal.        Behavior: Behavior normal.            Assessment & Plan:

## 2020-04-23 NOTE — Assessment & Plan Note (Signed)
For several weeks---though some better No worrisome symptoms to worry about CVA, bleed, tumor Not consistent with migraine (and no history) She wonders about sinus related--but no clear cut sinus symptoms and certainly nothing to suggest infection Could be related to heat---with running outside in the heat/humidity Drinks a fair amount of black tea (caffeinated)--but less at work (discussed the possibility of caffeine withdrawal headache)  Better now Discussed symptomatic Rx--but try to limit analgesics No reason for imaging

## 2020-05-04 DIAGNOSIS — G4452 New daily persistent headache (NDPH): Secondary | ICD-10-CM

## 2020-05-22 ENCOUNTER — Other Ambulatory Visit: Payer: Self-pay | Admitting: Specialist

## 2020-05-22 ENCOUNTER — Telehealth: Payer: Self-pay | Admitting: *Deleted

## 2020-05-22 NOTE — Telephone Encounter (Signed)
Pt called asking to schedule an appt with PCP. Pt said her dermatologist removed some skin cancer spots and she has stitches on her chest. The dermatologist is over an hr away and they told her if she didn't want to travel over an hr away to have her stitches removed her PCP can take them out for her. Pt is asking if Dr. Silvio Pate would remove her stitches for her so she doesn't have to drive a hour away. Routed to PCP for approval and also PCP has no openings this week, if Dr. Silvio Pate is okay removing stitches please advise if pt can be worked in sometime this week. I advise pt I would send message to PCP and his assistant will f/u with her

## 2020-05-22 NOTE — Telephone Encounter (Signed)
I am fine with removing her stitches if Thursday is not too soon.  I would prefer 1:45 (if there is someone to check her in)---otherwise 4:30

## 2020-05-22 NOTE — Telephone Encounter (Signed)
I left a detailed  message on patient's voice mail requesting she return my call and let me know if she can schedule appointment on Thursday at 1:45.  Raquel Sarna said there will be coverage.

## 2020-05-22 NOTE — Telephone Encounter (Signed)
Pt could not come @ 1:45.  There was an opening @ 3.  I put pt in that time slot

## 2020-05-23 ENCOUNTER — Other Ambulatory Visit: Payer: Self-pay | Admitting: Specialist

## 2020-05-23 DIAGNOSIS — R519 Headache, unspecified: Secondary | ICD-10-CM

## 2020-05-23 NOTE — Telephone Encounter (Signed)
Patient called back and said her dermatologist said the soonest she could have the stitches removed is on Friday.  Patient wants to know if Dr.Letvak can fit her in next week, to remove the stitches.

## 2020-05-23 NOTE — Telephone Encounter (Signed)
That is fine You can add her on at noon on Tuesday

## 2020-05-23 NOTE — Telephone Encounter (Signed)
Pt scheduled appointment 

## 2020-05-23 NOTE — Telephone Encounter (Signed)
I left a detailed message on patient's voice mail to let her know she can be seen on Tuesday at 12:00.

## 2020-05-24 ENCOUNTER — Ambulatory Visit: Payer: BC Managed Care – PPO | Admitting: Internal Medicine

## 2020-05-29 ENCOUNTER — Other Ambulatory Visit: Payer: Self-pay

## 2020-05-29 ENCOUNTER — Ambulatory Visit: Payer: BC Managed Care – PPO | Admitting: Internal Medicine

## 2020-05-29 ENCOUNTER — Encounter: Payer: Self-pay | Admitting: Internal Medicine

## 2020-05-29 DIAGNOSIS — C44519 Basal cell carcinoma of skin of other part of trunk: Secondary | ICD-10-CM | POA: Diagnosis not present

## 2020-05-29 DIAGNOSIS — G4452 New daily persistent headache (NDPH): Secondary | ICD-10-CM | POA: Diagnosis not present

## 2020-05-29 NOTE — Progress Notes (Signed)
Subjective:    Patient ID: Jenny Rangel, female    DOB: January 01, 1961, 59 y.o.   MRN: [id number]  HPI Here for suture removal ---after surgery to remove a basal cell carcinoma of skin This visit occurred during the SARS-CoV-2 public health emergency.  Safety protocols were in place, including screening questions prior to the visit, additional usage of staff PPE, and extensive cleaning of exam room while observing appropriate contact time as indicated for disinfecting solutions.   Had excision on chest wall 1.5 weeks ago The bandage has been irritating her Has been showering--then puts vaseline on it Tried hydrocortisone cream around irritated area---may have helped slightly  Current Outpatient Medications on File Prior to Visit  Medication Sig Dispense Refill  . Cholecalciferol (VITAMIN D3 PO) Take by mouth.    . cyanocobalamin 500 MCG tablet Take 500 mcg by mouth daily.    . pantoprazole (PROTONIX) 40 MG tablet Take 1 tablet (40 mg total) by mouth daily. 90 tablet 3   No current facility-administered medications on file prior to visit.    No Known Allergies  Past Medical History:  Diagnosis Date  . GERD (gastroesophageal reflux disease)   . Nephrolithiasis 1/14   non obstructing--on CT by Dr Gustavo Lah    Past Surgical History:  Procedure Laterality Date  . ESOPHAGOGASTRODUODENOSCOPY  08/99   normal  . VAGINAL DELIVERY     x4    Family History  Problem Relation Age of Onset  . Diabetes Neg Hx   . Heart disease Neg Hx   . Hypertension Neg Hx   . Ataxia Neg Hx   . Chorea Neg Hx   . Dementia Neg Hx   . Mental retardation Neg Hx   . Migraines Neg Hx   . Multiple sclerosis Neg Hx   . Neurofibromatosis Neg Hx   . Neuropathy Neg Hx   . Parkinsonism Neg Hx   . Seizures Neg Hx   . Stroke Neg Hx   . Breast cancer Neg Hx     Social History   Socioeconomic History  . Marital status: Married    Spouse name: Not on file  . Number of children: 4  . Years of education:  Not on file  . Highest education level: Not on file  Occupational History  . Occupation: Pharmacist, hospital, elementary school  Tobacco Use  . Smoking status: Never Smoker  . Smokeless tobacco: Never Used  Substance and Sexual Activity  . Alcohol use: No    Comment: occ  . Drug use: No  . Sexual activity: Not on file  Other Topics Concern  . Not on file  Social History Narrative  . Not on file   Social Determinants of Health   Financial Resource Strain:   . Difficulty of Paying Living Expenses: Not on file  Food Insecurity:   . Worried About Charity fundraiser in the Last Year: Not on file  . Ran Out of Food in the Last Year: Not on file  Transportation Needs:   . Lack of Transportation (Medical): Not on file  . Lack of Transportation (Non-Medical): Not on file  Physical Activity:   . Days of Exercise per Week: Not on file  . Minutes of Exercise per Session: Not on file  Stress:   . Feeling of Stress : Not on file  Social Connections:   . Frequency of Communication with Friends and Family: Not on file  . Frequency of Social Gatherings with Friends and Family: Not on  file  . Attends Religious Services: Not on file  . Active Member of Clubs or Organizations: Not on file  . Attends Archivist Meetings: Not on file  . Marital Status: Not on file  Intimate Partner Violence:   . Fear of Current or Ex-Partner: Not on file  . Emotionally Abused: Not on file  . Physically Abused: Not on file  . Sexually Abused: Not on file   Review of Systems     Objective:   Physical Exam Constitutional:      Appearance: Normal appearance.  Skin:    Comments: Horizontal incision on upper right chest Will apposed and no redness, etc Single running suture in place  Neurological:     Mental Status: She is alert.            Assessment & Plan:

## 2020-05-29 NOTE — Assessment & Plan Note (Signed)
Was not excited about the appt with Dr Domingo Cocking Will proceed with the MRI Some better lately--not sure she wants to start the new medication he suggested (?topiramate)

## 2020-05-29 NOTE — Assessment & Plan Note (Signed)
Well healed Did have subcutaneous sutures  Running prolene suture removed without incident Wound is well apposed even after suture removed No dressing needed

## 2020-06-11 ENCOUNTER — Other Ambulatory Visit: Payer: Self-pay

## 2020-06-11 ENCOUNTER — Ambulatory Visit
Admission: RE | Admit: 2020-06-11 | Discharge: 2020-06-11 | Disposition: A | Discharge status: Home / Self Care | Payer: BC Managed Care – PPO | Source: Ambulatory Visit | Attending: Specialist | Admitting: Specialist

## 2020-06-11 DIAGNOSIS — R519 Headache, unspecified: Secondary | ICD-10-CM

## 2020-06-28 DIAGNOSIS — G4489 Other headache syndrome: Secondary | ICD-10-CM

## 2021-06-18 ENCOUNTER — Telehealth: Payer: Self-pay | Admitting: Internal Medicine

## 2021-06-18 NOTE — Telephone Encounter (Signed)
Pt called in requesting to have Cpxlab done did not want schedule cpe . Please advise [phone]

## 2021-06-18 NOTE — Telephone Encounter (Signed)
Please let the pt know Dr Silvio Pate does same day cpe labs. He would not do CPE labs without them having a CPE.

## 2021-06-18 NOTE — Telephone Encounter (Signed)
LMTCB to schedule a CPE

## 2021-09-13 ENCOUNTER — Telehealth: Payer: Self-pay | Admitting: Internal Medicine

## 2021-09-13 NOTE — Telephone Encounter (Signed)
Pt returning your call

## 2021-09-13 NOTE — Telephone Encounter (Signed)
Spoke to pt. She did not get tested for flu or covid. Has URI symptoms 5 days ago. She feels better. I advised she may want to wear a mask if she feels she needs to. If her symptoms are improving and no fever, she is fine.

## 2021-09-13 NOTE — Telephone Encounter (Signed)
Pt called stating that she would like a call back to discuss her concerns about being around a baby with flu symptoms. Please advise.

## 2021-12-04 LAB — HM PAP SMEAR: HM Pap smear: NORMAL

## 2021-12-04 LAB — RESULTS CONSOLE HPV: CHL HPV: NEGATIVE

## 2021-12-09 ENCOUNTER — Other Ambulatory Visit: Payer: Self-pay | Admitting: Obstetrics and Gynecology

## 2021-12-09 DIAGNOSIS — Z1231 Encounter for screening mammogram for malignant neoplasm of breast: Secondary | ICD-10-CM

## 2022-01-06 ENCOUNTER — Encounter: Payer: Self-pay | Admitting: Internal Medicine

## 2022-01-06 ENCOUNTER — Ambulatory Visit (INDEPENDENT_AMBULATORY_CARE_PROVIDER_SITE_OTHER): Payer: BC Managed Care – PPO | Admitting: Internal Medicine

## 2022-01-06 VITALS — BP 142/80 | HR 110 | Ht 69.0 in | Wt 156.8 lb

## 2022-01-06 DIAGNOSIS — Z0000 Encounter for general adult medical examination without abnormal findings: Secondary | ICD-10-CM

## 2022-01-06 DIAGNOSIS — E278 Other specified disorders of adrenal gland: Secondary | ICD-10-CM

## 2022-01-06 DIAGNOSIS — R253 Fasciculation: Secondary | ICD-10-CM | POA: Diagnosis not present

## 2022-01-06 DIAGNOSIS — K219 Gastro-esophageal reflux disease without esophagitis: Secondary | ICD-10-CM | POA: Diagnosis not present

## 2022-01-06 MED ORDER — TIZANIDINE HCL 2 MG PO TABS: 2.0000 mg | ORAL_TABLET | Freq: Every evening | ORAL | 0 refills | Status: DC | PRN

## 2022-01-06 NOTE — Assessment & Plan Note (Signed)
Healthy but some troubling symptoms ?Colon due again 2026 ?Due for mammogram--has appt ?Pap recently at gyn ?Keeps up with exercise ?Prefers no COVID or flu vaccine. ?Will consider shingrix ?

## 2022-01-06 NOTE — Assessment & Plan Note (Signed)
Will check labs

## 2022-01-06 NOTE — Assessment & Plan Note (Signed)
Quiet now ?Off the protonix ?

## 2022-01-06 NOTE — Assessment & Plan Note (Signed)
May be stress related ?Will check labs ?Tizanidine '2mg'$  at bedtime prn ?

## 2022-01-06 NOTE — Progress Notes (Signed)
? ?Subjective:  ? ? Patient ID: DASJA BRASE, female    DOB: [birth year], 61 y.o.   MRN: [id number] ? ?HPI ?Here for physical ? ?Having some muscle "twitches" in the past month or so ?Can be in mouth, hands, etc ?Some ongoing stress---sister died in the fall, other stressors, does get "nervous feeling inside at times" ?Muscles will feel tight at times ?Still trying to exercise---runs, will be getting back to swimming ?No depression ?Still teaching ? ?Current Outpatient Medications on File Prior to Visit  ?Medication Sig Dispense Refill  ? Cholecalciferol (VITAMIN D3 PO) Take by mouth.    ? cyanocobalamin 500 MCG tablet Take 500 mcg by mouth daily.    ? solifenacin (VESICARE) 5 MG tablet Take by mouth.    ? pantoprazole (PROTONIX) 40 MG tablet Take 1 tablet (40 mg total) by mouth daily. (Patient not taking: Reported on 01/06/2022) 90 tablet 3  ? ?No current facility-administered medications on file prior to visit.  ? ? ?No Known Allergies ? ?Past Medical History:  ?Diagnosis Date  ? GERD (gastroesophageal reflux disease)   ? Nephrolithiasis 1/14  ? non obstructing--on CT by Dr Gustavo Lah  ? ? ?Past Surgical History:  ?Procedure Laterality Date  ? ESOPHAGOGASTRODUODENOSCOPY  08/99  ? normal  ? VAGINAL DELIVERY    ? x4  ? ? ?Family History  ?Problem Relation Age of Onset  ? Diabetes Neg Hx   ? Heart disease Neg Hx   ? Hypertension Neg Hx   ? Ataxia Neg Hx   ? Chorea Neg Hx   ? Dementia Neg Hx   ? Mental retardation Neg Hx   ? Migraines Neg Hx   ? Multiple sclerosis Neg Hx   ? Neurofibromatosis Neg Hx   ? Neuropathy Neg Hx   ? Parkinsonism Neg Hx   ? Seizures Neg Hx   ? Stroke Neg Hx   ? Breast cancer Neg Hx   ? ? ?Social History  ? ?Socioeconomic History  ? Marital status: Married  ?  Spouse name: Not on file  ? Number of children: 4  ? Years of education: Not on file  ? Highest education level: Not on file  ?Occupational History  ? Occupation: Pharmacist, hospital, elementary school  ?Tobacco Use  ? Smoking status: Never  ? Smokeless  tobacco: Never  ?Vaping Use  ? Vaping Use: Never used  ?Substance and Sexual Activity  ? Alcohol use: No  ?  Comment: occ  ? Drug use: No  ? Sexual activity: Not on file  ?Other Topics Concern  ? Not on file  ?Social History Narrative  ? Not on file  ? ?Social Determinants of Health  ? ?Financial Resource Strain: Not on file  ?Food Insecurity: Not on file  ?Transportation Needs: Not on file  ?Physical Activity: Not on file  ?Stress: Not on file  ?Social Connections: Not on file  ?Intimate Partner Violence: Not on file  ? ?Review of Systems  ?Constitutional:  Negative for fatigue and unexpected weight change.  ?     Wears seat belt  ?HENT:  Negative for dental problem, hearing loss, tinnitus and trouble swallowing.   ?     Keeps up with dentist  ?Eyes:  Negative for visual disturbance.  ?     No diplopia or unilateral vision loss  ?Respiratory:  Negative for cough, chest tightness and shortness of breath.   ?Cardiovascular:  Negative for chest pain, palpitations and leg swelling.  ?Gastrointestinal:  Negative for blood in  stool and constipation.  ?     No heartburn  ?Endocrine: Negative for polydipsia and polyuria.  ?Genitourinary:  Negative for difficulty urinating, dyspareunia and dysuria.  ?Musculoskeletal:  Negative for arthralgias, back pain and joint swelling.  ?Skin:   ?     Has a couple of spots on forehead she wants checked ?May have ingrown toenail---right great  ?Allergic/Immunologic: Negative for environmental allergies and immunocompromised state.  ?Neurological:  Negative for dizziness, syncope, light-headedness and headaches.  ?Hematological:  Negative for adenopathy. Does not bruise/bleed easily.  ?Psychiatric/Behavioral:  Negative for dysphoric mood. The patient is nervous/anxious.   ?     Some sleep problems  ? ?   ?Objective:  ? Physical Exam ?Constitutional:   ?   Appearance: Normal appearance.  ?HENT:  ?   Mouth/Throat:  ?   Pharynx: No oropharyngeal exudate or posterior oropharyngeal erythema.   ?Eyes:  ?   Conjunctiva/sclera: Conjunctivae normal.  ?   Pupils: Pupils are equal, round, and reactive to light.  ?Cardiovascular:  ?   Rate and Rhythm: Normal rate and regular rhythm.  ?   Pulses: Normal pulses.  ?   Heart sounds: No murmur heard. ?  No gallop.  ?Abdominal:  ?   Palpations: Abdomen is soft.  ?   Tenderness: There is no abdominal tenderness.  ?Musculoskeletal:  ?   Cervical back: Neck supple.  ?   Right lower leg: No edema.  ?   Left lower leg: No edema.  ?Lymphadenopathy:  ?   Cervical: No cervical adenopathy.  ?Neurological:  ?   General: No focal deficit present.  ?   Mental Status: She is alert and oriented to person, place, and time.  ?Psychiatric:     ?   Mood and Affect: Mood normal.     ?   Behavior: Behavior normal.  ?  ? ? ? ? ?   ?Assessment & Plan:  ? ?

## 2022-01-07 LAB — COMPREHENSIVE METABOLIC PANEL
ALT: 9 U/L (ref 0–35)
AST: 19 U/L (ref 0–37)
Albumin: 4.5 g/dL (ref 3.5–5.2)
Alkaline Phosphatase: 90 U/L (ref 39–117)
BUN: 10 mg/dL (ref 6–23)
CO2: 27 mEq/L (ref 19–32)
Calcium: 9.5 mg/dL (ref 8.4–10.5)
Chloride: 105 mEq/L (ref 96–112)
Creatinine, Ser: 0.76 mg/dL (ref 0.40–1.20)
GFR: 86.42 mL/min (ref >60.00)
Glucose, Bld: 108 mg/dL — ABNORMAL HIGH (ref 70–99)
Potassium: 5.2 mEq/L — ABNORMAL HIGH (ref 3.5–5.1)
Sodium: 139 mEq/L (ref 135–145)
Total Bilirubin: 0.4 mg/dL (ref 0.2–1.2)
Total Protein: 7.2 g/dL (ref 6.0–8.3)

## 2022-01-07 LAB — LIPID PANEL
Cholesterol: 207 mg/dL — ABNORMAL HIGH (ref 0–200)
HDL: 73.6 mg/dL (ref >39.00)
LDL Cholesterol: 116 mg/dL — ABNORMAL HIGH (ref 0–99)
NonHDL: 133.22
Total CHOL/HDL Ratio: 3
Triglycerides: 84 mg/dL (ref 0.0–149.0)
VLDL: 16.8 mg/dL (ref 0.0–40.0)

## 2022-01-07 LAB — CBC
HCT: 37.8 % (ref 36.0–46.0)
Hemoglobin: 12.6 g/dL (ref 12.0–15.0)
MCHC: 33.4 g/dL (ref 30.0–36.0)
MCV: 90.9 fl (ref 78.0–100.0)
Platelets: 213 10*3/uL (ref 150.0–400.0)
RBC: 4.15 Mil/uL (ref 3.87–5.11)
RDW: 13.8 % (ref 11.5–15.5)
WBC: 6.1 10*3/uL (ref 4.0–10.5)

## 2022-01-07 LAB — T4, FREE: Free T4: 0.75 ng/dL (ref 0.60–1.60)

## 2022-01-07 LAB — CORTISOL: Cortisol, Plasma: 12.6 ug/dL

## 2022-01-11 LAB — ALDOSTERONE + RENIN ACTIVITY W/ RATIO
ALDO / PRA Ratio: 5.3 Ratio (ref 0.9–28.9)
Aldosterone: 2 ng/dL
Renin Activity: 0.38 ng/mL/h (ref 0.25–5.82)

## 2022-01-15 ENCOUNTER — Ambulatory Visit
Admission: RE | Admit: 2022-01-15 | Discharge: 2022-01-15 | Disposition: A | Discharge status: Home / Self Care | Payer: BC Managed Care – PPO | Source: Ambulatory Visit | Attending: Obstetrics and Gynecology | Admitting: Obstetrics and Gynecology

## 2022-01-15 DIAGNOSIS — Z1231 Encounter for screening mammogram for malignant neoplasm of breast: Secondary | ICD-10-CM | POA: Diagnosis present

## 2022-02-21 ENCOUNTER — Telehealth: Payer: Self-pay | Admitting: Internal Medicine

## 2022-02-21 NOTE — Telephone Encounter (Signed)
Pt called and said she was seen in April and she wants to get a referral to possibly a neurologist, shes been having feeling of her nerves surging on and off for a few weeks and she would like to be seen by a neurologist. Call back is [phone]

## 2022-02-21 NOTE — Telephone Encounter (Signed)
Called pt back and advised if this was a new issue, she would need to be seen. She is scheduled to see Dr Silvio Pate 03-05-22. I also advised her that getting a referral from [place] would not get her seen sooner. She is going to call the neuro she was referred to a few years ago and see about getting in.

## 2022-03-05 ENCOUNTER — Ambulatory Visit: Payer: BC Managed Care – PPO | Admitting: Internal Medicine

## 2022-03-18 ENCOUNTER — Ambulatory Visit: Payer: BC Managed Care – PPO | Admitting: Internal Medicine

## 2022-05-29 LAB — HM HIV SCREENING LAB: HM HIV Screening: NEGATIVE

## 2022-05-29 LAB — HEPATITIS B SURFACE ANTIGEN: Hepatitis B Surface Ag: NEGATIVE

## 2022-05-29 LAB — HM HEPATITIS C SCREENING LAB: HM Hepatitis Screen: NEGATIVE

## 2022-05-30 ENCOUNTER — Other Ambulatory Visit: Payer: Self-pay | Admitting: Neurology

## 2022-05-30 DIAGNOSIS — R202 Paresthesia of skin: Secondary | ICD-10-CM

## 2022-05-30 DIAGNOSIS — R2990 Unspecified symptoms and signs involving the nervous system: Secondary | ICD-10-CM

## 2022-06-19 ENCOUNTER — Other Ambulatory Visit: Payer: BC Managed Care – PPO

## 2022-08-29 IMAGING — MG MM DIGITAL SCREENING BILAT W/ TOMO AND CAD
6 of 10 series · 6 of 30 positions shown · non-contrast
Comparison: Previous exam(s).

CLINICAL DATA: Screening.

EXAM:
DIGITAL SCREENING BILATERAL MAMMOGRAM WITH TOMOSYNTHESIS AND CAD
TECHNIQUE: Bilateral screening digital craniocaudal and mediolateral oblique
mammograms were obtained. Bilateral screening digital breast
tomosynthesis was performed. The images were evaluated with
computer-aided detection.

[R MLO synth-2D]
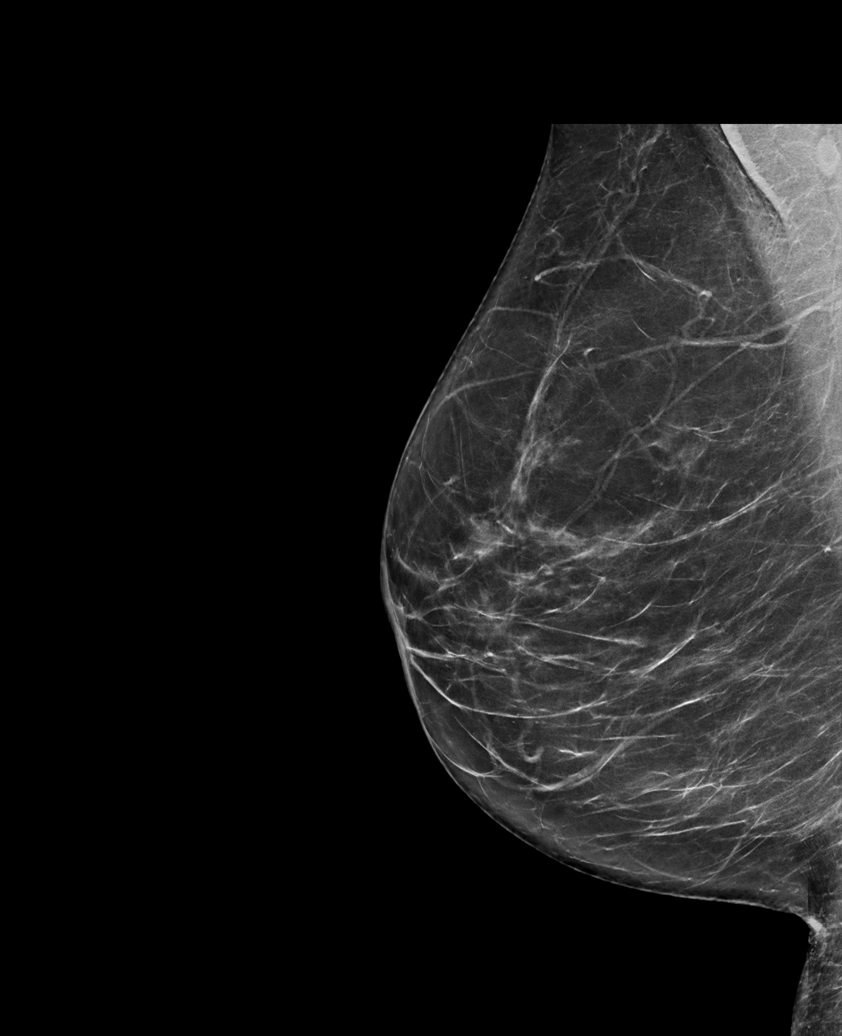

[L CC synth-2D (1 of 2)]
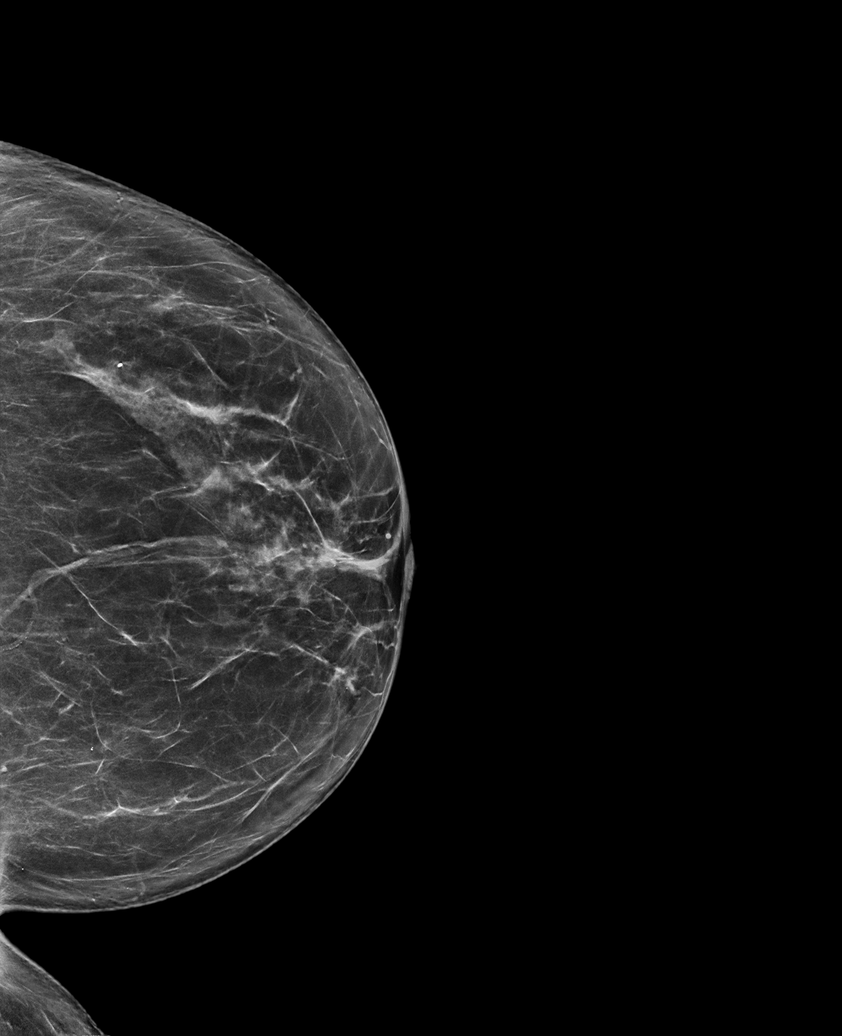

[R CC synth-2D]
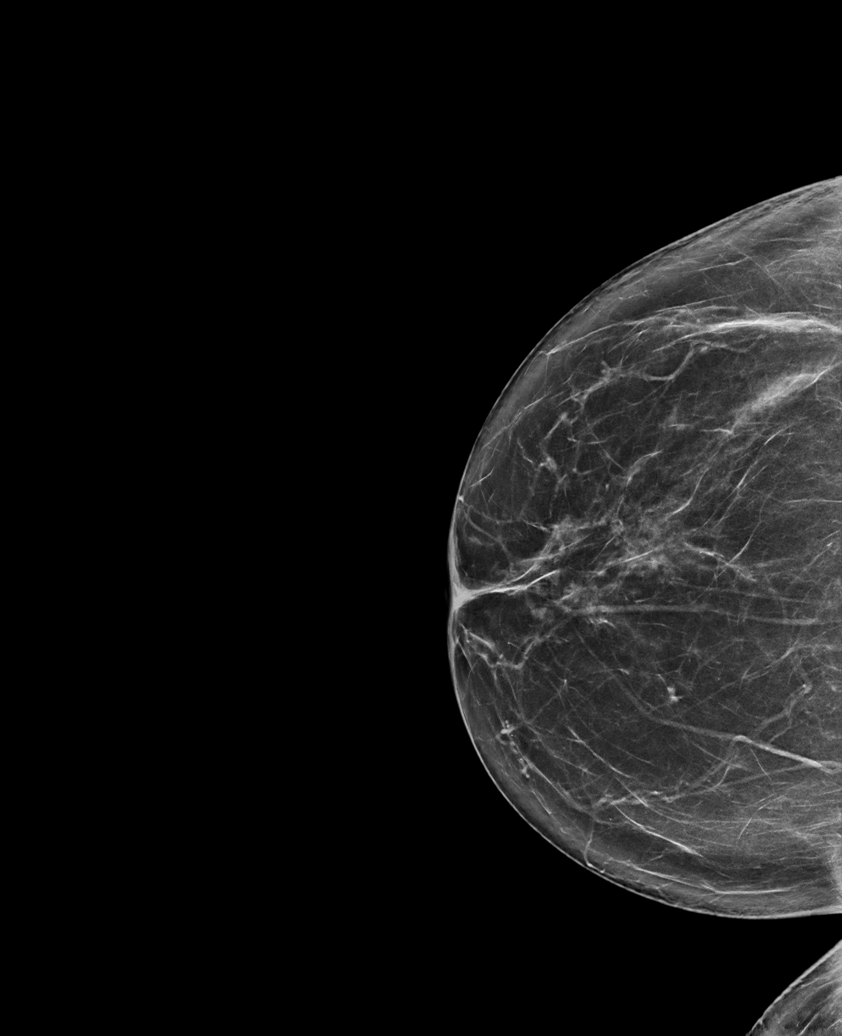

[L MLO synth-2D]
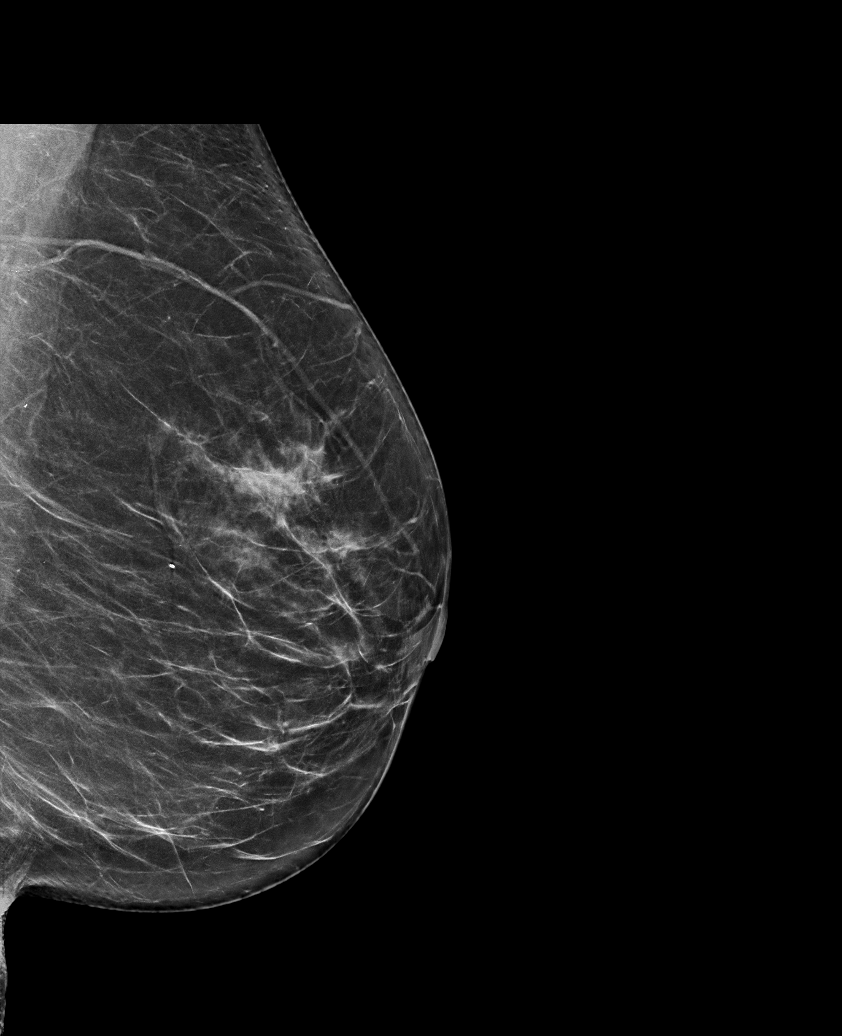

[L CC synth-2D (2 of 2)]
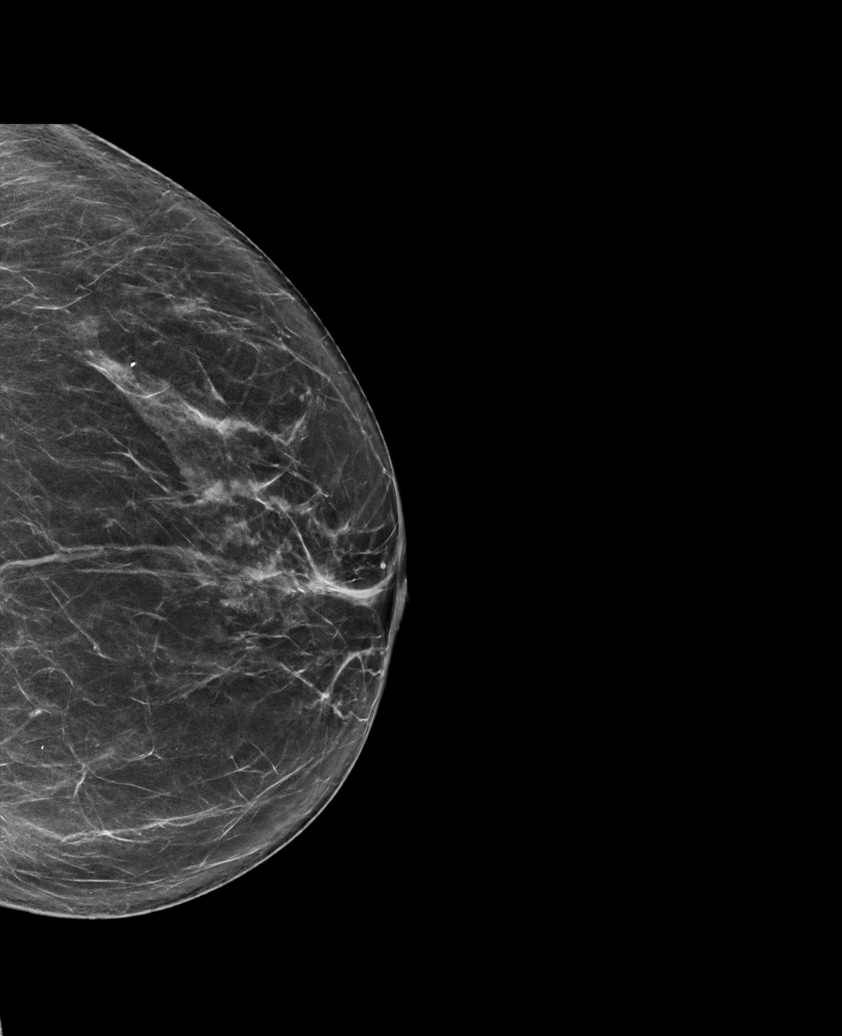

[R MLO tomo · tomo slice 39/76.0]
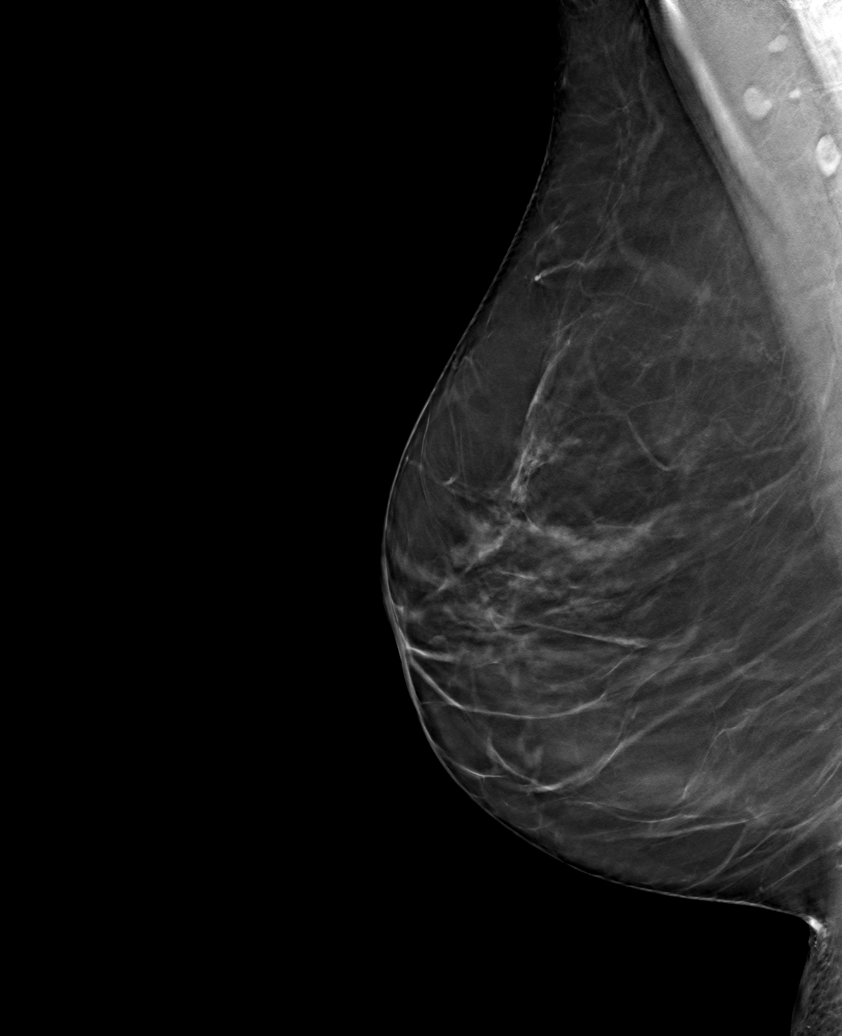

[6 of 30 positions shown; findings below may reference images not displayed]

ACR Breast Density Category c: The breast tissue is heterogeneously
dense, which may obscure small masses.
FINDINGS: There are no findings suspicious for malignancy.
IMPRESSION: No mammographic evidence of malignancy. A result letter of this
screening mammogram will be mailed directly to the patient.

RECOMMENDATION:
Screening mammogram in one year. (Code:[id number]-BC3)

BI-RADS CATEGORY  1: Negative.

## 2022-10-21 ENCOUNTER — Telehealth: Payer: Self-pay | Admitting: Internal Medicine

## 2022-10-21 NOTE — Telephone Encounter (Signed)
Patient called in stated that she has a [employer] she is wearing. She stated that the device states she can send the results over but she needs the [facility] ID to send the results over. Please advise. Thank you!

## 2022-10-22 NOTE — Telephone Encounter (Signed)
Left message on VM per DPR advising pt that our office does not usually do the downloads. That is usually done at endocrinology.

## 2023-04-06 ENCOUNTER — Ambulatory Visit: Payer: BC Managed Care – PPO | Admitting: Internal Medicine

## 2023-04-20 ENCOUNTER — Ambulatory Visit: Payer: BC Managed Care – PPO | Admitting: Internal Medicine

## 2023-06-19 ENCOUNTER — Ambulatory Visit: Payer: BC Managed Care – PPO | Admitting: Internal Medicine

## 2023-06-19 ENCOUNTER — Encounter: Payer: Self-pay | Admitting: Internal Medicine

## 2023-06-19 VITALS — BP 138/80 | HR 140 | Temp 97.7°F | Ht 69.0 in | Wt 145.0 lb

## 2023-06-19 DIAGNOSIS — R000 Tachycardia, unspecified: Secondary | ICD-10-CM | POA: Diagnosis not present

## 2023-06-19 LAB — CBC
HCT: 39.4 % (ref 36.0–46.0)
Hemoglobin: 12.7 g/dL (ref 12.0–15.0)
MCHC: 32.1 g/dL (ref 30.0–36.0)
MCV: 88.7 fL (ref 78.0–100.0)
Platelets: 216 10*3/uL (ref 150.0–400.0)
RBC: 4.44 Mil/uL (ref 3.87–5.11)
RDW: 13.5 % (ref 11.5–15.5)
WBC: 5.1 10*3/uL (ref 4.0–10.5)

## 2023-06-19 LAB — COMPREHENSIVE METABOLIC PANEL
ALT: 12 U/L (ref 0–35)
AST: 16 U/L (ref 0–37)
Albumin: 4.1 g/dL (ref 3.5–5.2)
Alkaline Phosphatase: 92 U/L (ref 39–117)
BUN: 20 mg/dL (ref 6–23)
CO2: 26 meq/L (ref 19–32)
Calcium: 9.4 mg/dL (ref 8.4–10.5)
Chloride: 108 meq/L (ref 96–112)
Creatinine, Ser: 0.64 mg/dL (ref 0.40–1.20)
GFR: 96.48 mL/min (ref >60.00)
Glucose, Bld: 112 mg/dL — ABNORMAL HIGH (ref 70–99)
Potassium: 4.9 meq/L (ref 3.5–5.1)
Sodium: 141 meq/L (ref 135–145)
Total Bilirubin: 0.5 mg/dL (ref 0.2–1.2)
Total Protein: 6.7 g/dL (ref 6.0–8.3)

## 2023-06-19 LAB — LIPID PANEL
Cholesterol: 164 mg/dL (ref 0–200)
HDL: 73.6 mg/dL (ref >39.00)
LDL Cholesterol: 80 mg/dL (ref 0–99)
NonHDL: 90.67
Total CHOL/HDL Ratio: 2
Triglycerides: 54 mg/dL (ref 0.0–149.0)
VLDL: 10.8 mg/dL (ref 0.0–40.0)

## 2023-06-19 LAB — TSH: TSH: <0.01 u[IU]/mL — ABNORMAL LOW (ref 0.35–5.50)

## 2023-06-19 MED ORDER — METOPROLOL SUCCINATE ER 25 MG PO TB24: 25.0000 mg | ORAL_TABLET | Freq: Every day | ORAL | 3 refills | Status: DC

## 2023-06-19 NOTE — Progress Notes (Signed)
Subjective:    Patient ID: Jenny Rangel, female    DOB: December 21, 1960, 62 y.o.   MRN: [id number]  HPI Here due to sense of rapid heart rate  For several days, she feels her heart racing (like in 80-90's) Sometimes over a 100 Has "unsettling" feeling with the heart Thinks her rate is usually in the low 70's Has sense of heart beating though---especially in bed  Has I-watch---no reports on that  No chest pain No SOB Some exercising the past few days--no more aware of heart then No edema  Current Outpatient Medications on File Prior to Visit  Medication Sig Dispense Refill   Cholecalciferol (VITAMIN D3 PO) Take by mouth.     solifenacin (VESICARE) 5 MG tablet Take by mouth.     No current facility-administered medications on file prior to visit.    No Known Allergies  Past Medical History:  Diagnosis Date   GERD (gastroesophageal reflux disease)    Nephrolithiasis 1/14   non obstructing--on CT by Dr Marva Panda    Past Surgical History:  Procedure Laterality Date   ESOPHAGOGASTRODUODENOSCOPY  08/99   normal   VAGINAL DELIVERY     x4    Family History  Problem Relation Age of Onset   Depression Sister        committed suicide   Diabetes Neg Hx    Heart disease Neg Hx    Hypertension Neg Hx    Ataxia Neg Hx    Chorea Neg Hx    Dementia Neg Hx    Mental retardation Neg Hx    Migraines Neg Hx    Multiple sclerosis Neg Hx    Neurofibromatosis Neg Hx    Neuropathy Neg Hx    Parkinsonism Neg Hx    Seizures Neg Hx    Stroke Neg Hx    Breast cancer Neg Hx     Social History   Socioeconomic History   Marital status: Married    Spouse name: Not on file   Number of children: 4   Years of education: Not on file   Highest education level: Not on file  Occupational History   Occupation: Runner, broadcasting/film/video, elementary school  Tobacco Use   Smoking status: Never   Smokeless tobacco: Never  Vaping Use   Vaping status: Never Used  Substance and Sexual Activity   Alcohol  use: No    Comment: occ   Drug use: No   Sexual activity: Not on file  Other Topics Concern   Not on file  Social History Narrative   Not on file   Social Determinants of Health   Financial Resource Strain: Not on file  Food Insecurity: Not on file  Transportation Needs: Not on file  Physical Activity: Not on file  Stress: Not on file  Social Connections: Not on file  Intimate Partner Violence: Not on file   Review of Systems No major stress or emotional issues Sleeping okay Appetite is fine--weight stable     Objective:   Physical Exam Constitutional:      Appearance: Normal appearance.  Cardiovascular:     Rate and Rhythm: Regular rhythm. Tachycardia present.     Heart sounds: No murmur heard.    No gallop.     Comments: Rate down to 120 Pulmonary:     Effort: Pulmonary effort is normal.     Breath sounds: Normal breath sounds. No wheezing or rales.  Abdominal:     Palpations: Abdomen is soft.  Tenderness: There is no abdominal tenderness.  Musculoskeletal:     Cervical back: Neck supple.     Right lower leg: No edema.     Left lower leg: No edema.  Lymphadenopathy:     Cervical: No cervical adenopathy.  Neurological:     Mental Status: She is alert.            Assessment & Plan:

## 2023-06-19 NOTE — Assessment & Plan Note (Addendum)
Her rate at home and 120 here suggests sinus Will check EKG  EKG--sinus tach at 141. Right atrial enlargement, non specific ST/T changes. Past EKGs show sinus tach but not this fast.  Will start metoprolol 25 daily Check labs Set up with cardiology

## 2023-06-20 ENCOUNTER — Other Ambulatory Visit: Payer: Self-pay | Admitting: Internal Medicine

## 2023-06-20 DIAGNOSIS — R946 Abnormal results of thyroid function studies: Secondary | ICD-10-CM

## 2023-06-22 ENCOUNTER — Other Ambulatory Visit (INDEPENDENT_AMBULATORY_CARE_PROVIDER_SITE_OTHER): Payer: BC Managed Care – PPO

## 2023-06-22 ENCOUNTER — Telehealth: Payer: Self-pay | Admitting: Internal Medicine

## 2023-06-22 DIAGNOSIS — R946 Abnormal results of thyroid function studies: Secondary | ICD-10-CM

## 2023-06-22 NOTE — Telephone Encounter (Signed)
That is of limited additional value but let her know that I did add it

## 2023-06-22 NOTE — Telephone Encounter (Signed)
Left message on VM per DPR. 

## 2023-06-22 NOTE — Telephone Encounter (Signed)
Pt called asking if T3 labs could be added to other lab orders? Call back # [phone]

## 2023-06-23 LAB — T4, FREE: Free T4: 2.43 ng/dL — ABNORMAL HIGH (ref 0.60–1.60)

## 2023-06-23 LAB — TSH: TSH: 0 u[IU]/mL — ABNORMAL LOW (ref 0.35–5.50)

## 2023-06-24 ENCOUNTER — Encounter: Payer: Self-pay | Admitting: Internal Medicine

## 2023-06-24 ENCOUNTER — Ambulatory Visit: Payer: BC Managed Care – PPO | Admitting: Internal Medicine

## 2023-06-24 VITALS — BP 138/78 | HR 132 | Temp 98.3°F | Ht 69.0 in | Wt 145.0 lb

## 2023-06-24 DIAGNOSIS — E0500 Thyrotoxicosis with diffuse goiter without thyrotoxic crisis or storm: Secondary | ICD-10-CM | POA: Insufficient documentation

## 2023-06-24 DIAGNOSIS — E0590 Thyrotoxicosis, unspecified without thyrotoxic crisis or storm: Secondary | ICD-10-CM | POA: Diagnosis not present

## 2023-06-24 LAB — T3: T3, Total: 217 ng/dL — ABNORMAL HIGH (ref 76–181)

## 2023-06-24 LAB — THYROGLOBULIN ANTIBODY: Thyroglobulin Ab: 1 [IU]/mL (ref <=1)

## 2023-06-24 MED ORDER — METHIMAZOLE 10 MG PO TABS: 10.0000 mg | ORAL_TABLET | Freq: Three times a day (TID) | ORAL | 3 refills | Status: DC

## 2023-06-24 NOTE — Assessment & Plan Note (Signed)
Probably Graves's disease but still awaiting antibody test  If negative, will check thyroid ultrasound Will continue the metoprolol--can cancel cardiology. Will hold off on increasing since symptoms are mild and we are starting definitive therapy  Discussed options---she is wondering about endocrinology. Told her I am comfortable with Rx but I can make referral (she would like that) Will start methimazole 10mg  daily. Plan to see back in 1 month to recheck labs Discussed Rx with radioactive iodine or surgery--- I don't recommend doing this for now

## 2023-06-24 NOTE — Progress Notes (Signed)
   Subjective:    Patient ID: Jenny Rangel, female    DOB: 1961-03-21, 62 y.o.   MRN: [id number]  HPI Here to discuss hyperthyroidism  No major change in heart rate on the metoprolol Resting heart rate usually in the 90's---then up to 110-120 with activity No chest pain Aware of her heart--mostly at night (though pulse in 90's) Feels warm but not sweaty  Current Outpatient Medications on File Prior to Visit  Medication Sig Dispense Refill   Cholecalciferol (VITAMIN D3 PO) Take by mouth.     metoprolol succinate (TOPROL-XL) 25 MG 24 hr tablet Take 1 tablet (25 mg total) by mouth daily. 30 tablet 3   No current facility-administered medications on file prior to visit.    No Known Allergies  Past Medical History:  Diagnosis Date   GERD (gastroesophageal reflux disease)    Nephrolithiasis 1/14   non obstructing--on CT by Dr Marva Panda    Past Surgical History:  Procedure Laterality Date   ESOPHAGOGASTRODUODENOSCOPY  08/99   normal   VAGINAL DELIVERY     x4    Family History  Problem Relation Age of Onset   Depression Sister        committed suicide   Diabetes Neg Hx    Heart disease Neg Hx    Hypertension Neg Hx    Ataxia Neg Hx    Chorea Neg Hx    Dementia Neg Hx    Mental retardation Neg Hx    Migraines Neg Hx    Multiple sclerosis Neg Hx    Neurofibromatosis Neg Hx    Neuropathy Neg Hx    Parkinsonism Neg Hx    Seizures Neg Hx    Stroke Neg Hx    Breast cancer Neg Hx     Social History   Socioeconomic History   Marital status: Married    Spouse name: Not on file   Number of children: 4   Years of education: Not on file   Highest education level: Not on file  Occupational History   Occupation: Runner, broadcasting/film/video, elementary school  Tobacco Use   Smoking status: Never   Smokeless tobacco: Never  Vaping Use   Vaping status: Never Used  Substance and Sexual Activity   Alcohol use: No    Comment: occ   Drug use: No   Sexual activity: Not on file  Other  Topics Concern   Not on file  Social History Narrative   Not on file   Social Determinants of Health   Financial Resource Strain: Not on file  Food Insecurity: Not on file  Transportation Needs: Not on file  Physical Activity: Not on file  Stress: Not on file  Social Connections: Not on file  Intimate Partner Violence: Not on file   Review of Systems Was taking some iodine drops a few months ago Then just restarted a few days ago---then stopped it    Objective:   Physical Exam Eyes:     Comments: Eyes look normal  Neck:     Comments: Thyroid not palpable           Assessment & Plan:

## 2023-06-25 ENCOUNTER — Ambulatory Visit: Payer: BC Managed Care – PPO | Admitting: Internal Medicine

## 2023-06-25 ENCOUNTER — Other Ambulatory Visit: Payer: Self-pay | Admitting: Internal Medicine

## 2023-06-25 ENCOUNTER — Other Ambulatory Visit (INDEPENDENT_AMBULATORY_CARE_PROVIDER_SITE_OTHER): Payer: BC Managed Care – PPO

## 2023-06-25 ENCOUNTER — Ambulatory Visit: Payer: BC Managed Care – PPO | Admitting: Family Medicine

## 2023-06-25 DIAGNOSIS — E0590 Thyrotoxicosis, unspecified without thyrotoxic crisis or storm: Secondary | ICD-10-CM

## 2023-06-29 ENCOUNTER — Encounter: Payer: Self-pay | Admitting: Internal Medicine

## 2023-06-29 NOTE — Progress Notes (Signed)
Left message on VM per DPR that we need her to call and schedule another lab appointment this week.

## 2023-07-01 ENCOUNTER — Encounter: Payer: Self-pay | Admitting: Internal Medicine

## 2023-07-01 LAB — THYROID STIMULATING IMMUNOGLOBULIN: TSI: 310 % baseline — ABNORMAL HIGH (ref <140)

## 2023-07-07 ENCOUNTER — Telehealth: Payer: Self-pay | Admitting: Internal Medicine

## 2023-07-07 NOTE — Telephone Encounter (Signed)
Patient called in stating that she reached out to [facility] clinic regarding referral that was sent out on 06/24/2023,and they told her that they do not have any record of the referral being sent to them.Could you assist with this please.

## 2023-07-08 ENCOUNTER — Telehealth: Payer: Self-pay | Admitting: Internal Medicine

## 2023-07-08 MED ORDER — METHIMAZOLE 10 MG PO TABS: 10.0000 mg | ORAL_TABLET | Freq: Every day | ORAL | 0 refills | Status: DC

## 2023-07-08 NOTE — Telephone Encounter (Addendum)
I spoke with pt; pt said heart rate usually in the 90s but recently P 120 - 135. Per pts watch now pulse is 104. Pt has not taken BP. Pt said when she went home later today she would take her BP. Pt said she was not sure if she had "worked herself up" making the heart rate to go up.pt said she is not sure if metoprolol is working. Pt taking metoprolol 25 mg at noon and pt taking methimazole 10 mg in the morning. On pt med list has methimazole 10 mg tid but pt said Dr Alphonsus Sias advised her to only take it once a day.  No CP and no trouble breathing but pt said she has a sensation at diaphragm on and off for 1 month and pt said Dr Alphonsus Sias is aware.I offered pt an appt to be seen and pt said she only wanted to see Dr Alphonsus Sias. I offered to schedule appt next Mon with Dr Alphonsus Sias but pt declined and said if she needed appt she would call back. Pt said she has endo appt at [facility] 07/15/[facility].UC & ED precautions given and pt voiced understanding. Will send note to Dr Alphonsus Sias who is out of office and Dr Milinda Antis who is in office and Tower pool for review. [address]

## 2023-07-08 NOTE — Telephone Encounter (Signed)
Karie Schwalbe, MD  to Me  Melynda Keller     07/08/23  4:28 PM  Methimazole is daily for now--I corrected it   Thank you and yes pt was sure the methimazole was just once a day.

## 2023-07-08 NOTE — Addendum Note (Signed)
Addended by: Tillman Abide I on: 07/08/2023 04:28 PM   Modules accepted: Orders

## 2023-07-08 NOTE — Telephone Encounter (Signed)
FYI: This call has been transferred to Access Nurse. Once the result note has been entered staff can address the message at that time.  Patient called in with the following symptoms:  Red Word: elevated pulse Patient called in and stated that her pulse has been high. She stated that it normally is around 90 but its been jumping anywhere from 120 to 135, but right now its at 122. She stated that she suffers from Hyperthyroidism.   Please advise at Mobile [phone] (mobile)  Message is routed to Provider Pool and [facility] Triage

## 2023-07-08 NOTE — Telephone Encounter (Signed)
Unable to reach patient by phone and left v/m requesting call back at [phone]. Sending note to [facility]sc triage.

## 2023-07-27 ENCOUNTER — Ambulatory Visit: Payer: BC Managed Care – PPO | Admitting: Internal Medicine

## 2023-07-29 ENCOUNTER — Other Ambulatory Visit: Payer: Self-pay | Admitting: Obstetrics and Gynecology

## 2023-07-29 DIAGNOSIS — Z1231 Encounter for screening mammogram for malignant neoplasm of breast: Secondary | ICD-10-CM

## 2023-08-13 ENCOUNTER — Ambulatory Visit
Admission: RE | Admit: 2023-08-13 | Discharge: 2023-08-13 | Disposition: A | Discharge status: Home / Self Care | Payer: BC Managed Care – PPO | Source: Ambulatory Visit | Attending: Obstetrics and Gynecology | Admitting: Obstetrics and Gynecology

## 2023-08-13 DIAGNOSIS — Z1231 Encounter for screening mammogram for malignant neoplasm of breast: Secondary | ICD-10-CM | POA: Diagnosis present

## 2023-09-24 ENCOUNTER — Telehealth: Payer: Self-pay

## 2023-09-24 NOTE — Telephone Encounter (Signed)
 Copied from CRM [id number]. Topic: Clinical - Request for Lab/Test Order >> Sep 24, 2023 11:20 AM Jenny Rangel wrote: Reason for CRM: Pt called stating she was seeing a specialist at [facility], but she would like to go back to Dr. Jimmy and want to see if she can get her blood work that the specialist ordered done at the [facility] office. Pt also want the provider to look at her [facility] chart

## 2023-09-24 NOTE — Telephone Encounter (Signed)
 Left a message on VM per DPR advisinf pt that we do not usually do outside provider labs, but depending on what was requested, Dr Alphonsus Sias could possibly order them under his name. I suggested she call and schedule an OV with Dr Alphonsus Sias to go over things.

## 2023-09-28 ENCOUNTER — Telehealth: Payer: Self-pay

## 2023-09-28 NOTE — Telephone Encounter (Signed)
 Copied from CRM [id number]. Topic: Clinical - Medical Advice >> Sep 28, 2023 11:19 AM Sonny Dandy B wrote: Reason for CRM: pt called to speak with [facility]. Requesting a call back at [phone]

## 2023-09-28 NOTE — Telephone Encounter (Addendum)
 Left message on VM per DPR that Dr Alphonsus Sias will do labs the day of her appointment.

## 2023-09-28 NOTE — Telephone Encounter (Signed)
 Copied from CRM [id number]. Topic: General - Other >> Sep 28, 2023  9:30 AM Turkey A wrote: Reason for CRM: Patient was calling to ask if she needs lab work completed before her appointment with Pearlie Oyster

## 2023-09-29 NOTE — Telephone Encounter (Signed)
 Left message returning pt.'s call.

## 2023-09-29 NOTE — Telephone Encounter (Signed)
 See previous message already open about calls to her about labs

## 2023-10-22 ENCOUNTER — Ambulatory Visit: Payer: 59 | Admitting: Internal Medicine

## 2023-10-22 ENCOUNTER — Encounter: Payer: Self-pay | Admitting: Internal Medicine

## 2023-10-22 ENCOUNTER — Ambulatory Visit: Payer: Self-pay | Admitting: Internal Medicine

## 2023-10-22 VITALS — BP 130/80 | HR 84 | Temp 97.5°F | Ht 69.0 in | Wt 148.0 lb

## 2023-10-22 DIAGNOSIS — E0500 Thyrotoxicosis with diffuse goiter without thyrotoxic crisis or storm: Secondary | ICD-10-CM | POA: Diagnosis not present

## 2023-10-22 NOTE — Assessment & Plan Note (Signed)
Seems to be in remission on the methimazole 10mg  daily Stopped the metoprolol as heart rate normalized Will recheck labs Will plan to wean to 5mg  at next visit if TSH normalizes

## 2023-10-22 NOTE — Progress Notes (Signed)
Subjective:    Patient ID: Jenny Rangel, female    DOB: [birth year], 63 y.o.   MRN: [id number]  HPI Here for follow up of Grave's disease  Feeling better now With improvement--- heart not beating fast anymore Stopped the methimazole  Went to endocrinologist---NP No changes made Thyroxine had normalized---TSH still low  Current Outpatient Medications on File Prior to Visit  Medication Sig Dispense Refill   Cholecalciferol (VITAMIN D3 PO) Take by mouth.     methimazole (TAPAZOLE) 10 MG tablet Take 1 tablet (10 mg total) by mouth daily. 1 tablet 0   No current facility-administered medications on file prior to visit.    No Known Allergies  Past Medical History:  Diagnosis Date   GERD (gastroesophageal reflux disease)    Nephrolithiasis 1/14   non obstructing--on CT by Dr Marva Panda    Past Surgical History:  Procedure Laterality Date   ESOPHAGOGASTRODUODENOSCOPY  08/99   normal   VAGINAL DELIVERY     x4    Family History  Problem Relation Age of Onset   Depression Sister        committed suicide   Diabetes Neg Hx    Heart disease Neg Hx    Hypertension Neg Hx    Ataxia Neg Hx    Chorea Neg Hx    Dementia Neg Hx    Mental retardation Neg Hx    Migraines Neg Hx    Multiple sclerosis Neg Hx    Neurofibromatosis Neg Hx    Neuropathy Neg Hx    Parkinsonism Neg Hx    Seizures Neg Hx    Stroke Neg Hx    Breast cancer Neg Hx     Social History   Socioeconomic History   Marital status: Married    Spouse name: Not on file   Number of children: 4   Years of education: Not on file   Highest education level: Bachelor's degree (e.g., BA, AB, BS)  Occupational History   Occupation: Runner, broadcasting/film/video, elementary school  Tobacco Use   Smoking status: Never   Smokeless tobacco: Never  Vaping Use   Vaping status: Never Used  Substance and Sexual Activity   Alcohol use: No    Comment: occ   Drug use: No   Sexual activity: Not on file  Other Topics Concern   Not on file   Social History Narrative   Not on file   Social Drivers of Health   Financial Resource Strain: Low Risk  (10/19/2023)   Overall Financial Resource Strain (CARDIA)    Difficulty of Paying Living Expenses: Not hard at all  Food Insecurity: No Food Insecurity (10/19/2023)   Hunger Vital Sign    Worried About Running Out of Food in the Last Year: Never true    Ran Out of Food in the Last Year: Never true  Transportation Needs: No Transportation Needs (10/19/2023)   PRAPARE - Administrator, Civil Service (Medical): No    Lack of Transportation (Non-Medical): No  Physical Activity: Insufficiently Active (10/19/2023)   Exercise Vital Sign    Days of Exercise per Week: 4 days    Minutes of Exercise per Session: 30 min  Stress: No Stress Concern Present (10/19/2023)   [employer] of Occupational Health - Occupational Stress Questionnaire    Feeling of Stress : Not at all  Social Connections: Socially Integrated (10/19/2023)   Social Connection and Isolation Panel [NHANES]    Frequency of Communication with Friends and Family: More than  three times a week    Frequency of Social Gatherings with Friends and Family: Three times a week    Attends Religious Services: More than 4 times per year    Active Member of Clubs or Organizations: Yes    Attends Engineer, structural: More than 4 times per year    Marital Status: Married  Catering manager Violence: Not on file   Review of Systems Appetite is good Has regained some weight    Objective:   Physical Exam Constitutional:      Appearance: Normal appearance.  Neck:     Comments: No palpable thyroid Cardiovascular:     Rate and Rhythm: Normal rate and regular rhythm.  Musculoskeletal:     Cervical back: Neck supple. No tenderness.  Neurological:     Mental Status: She is alert.            Assessment & Plan:

## 2023-10-22 NOTE — Telephone Encounter (Signed)
Late entry for late arrival to appt Message from Yeoman J sent at 10/22/2023  3:30 PM EST  Patient states she is running late and maybe 10 minutes late, did advise of grace period    Call History  Contact Date/Time Type Contact Phone/Fax By  10/22/2023 03:25 PM EST Phone (Incoming) Jenny, Rangel [phone] Andre Lefort

## 2023-10-23 ENCOUNTER — Encounter: Payer: Self-pay | Admitting: Internal Medicine

## 2023-10-23 LAB — COMPREHENSIVE METABOLIC PANEL
ALT: 7 U/L (ref 0–35)
AST: 17 U/L (ref 0–37)
Albumin: 4.4 g/dL (ref 3.5–5.2)
Alkaline Phosphatase: 167 U/L — ABNORMAL HIGH (ref 39–117)
BUN: 21 mg/dL (ref 6–23)
CO2: 26 meq/L (ref 19–32)
Calcium: 9.1 mg/dL (ref 8.4–10.5)
Chloride: 105 meq/L (ref 96–112)
Creatinine, Ser: 0.95 mg/dL (ref 0.40–1.20)
GFR: 65.29 mL/min (ref >60.00)
Glucose, Bld: 84 mg/dL (ref 70–99)
Potassium: 4.8 meq/L (ref 3.5–5.1)
Sodium: 140 meq/L (ref 135–145)
Total Bilirubin: 0.4 mg/dL (ref 0.2–1.2)
Total Protein: 7.1 g/dL (ref 6.0–8.3)

## 2023-10-23 LAB — T3: T3, Total: 78 ng/dL (ref 76–181)

## 2023-10-23 LAB — CBC
HCT: 38.9 % (ref 36.0–46.0)
Hemoglobin: 12.9 g/dL (ref 12.0–15.0)
MCHC: 33.3 g/dL (ref 30.0–36.0)
MCV: 88.9 fL (ref 78.0–100.0)
Platelets: 233 10*3/uL (ref 150.0–400.0)
RBC: 4.37 Mil/uL (ref 3.87–5.11)
RDW: 16 % — ABNORMAL HIGH (ref 11.5–15.5)
WBC: 5.8 10*3/uL (ref 4.0–10.5)

## 2023-10-23 LAB — T4, FREE: Free T4: 0.8 ng/dL (ref 0.60–1.60)

## 2023-10-23 LAB — TSH: TSH: 1.95 u[IU]/mL (ref 0.35–5.50)

## 2023-10-28 ENCOUNTER — Encounter: Payer: Self-pay | Admitting: Internal Medicine

## 2023-11-09 MED ORDER — METHIMAZOLE 5 MG PO TABS: 5.0000 mg | ORAL_TABLET | Freq: Three times a day (TID) | ORAL | 3 refills | Status: AC

## 2023-12-14 ENCOUNTER — Ambulatory Visit: Admitting: Internal Medicine

## 2024-02-09 ENCOUNTER — Encounter: Payer: Self-pay | Admitting: Internal Medicine

## 2024-03-04 ENCOUNTER — Encounter: Payer: Self-pay | Admitting: Internal Medicine

## 2024-03-11 ENCOUNTER — Telehealth: Payer: Self-pay

## 2024-03-11 MED ORDER — PANTOPRAZOLE SODIUM 40 MG PO TBEC: 40.0000 mg | DELAYED_RELEASE_TABLET | Freq: Every day | ORAL | 3 refills | Status: DC

## 2024-03-11 NOTE — Telephone Encounter (Signed)
 Let her know that I sent the prescription Make sure she knows it needs to be taken on an empty stomach

## 2024-03-11 NOTE — Telephone Encounter (Signed)
 Pt has a CPE next week. Taking Pepcid but its not helping.  Has an Appt to GI in October. Was on pantoprazole  40 mg in the past. Asking if rx can be doe. Send to [address]

## 2024-03-11 NOTE — Telephone Encounter (Signed)
 Spoke to pt

## 2024-03-11 NOTE — Telephone Encounter (Signed)
 Copied from CRM [id number]. Topic: Clinical - Medication Question >> Mar 11, 2024  9:11 AM Earnestine Goes B wrote: Reason for CRM:pt called to speak with provider about medication for acid reflux, pt states she was on the medication in the past. Has not taking it in years. Pt is requesting a call back at [phone] >> Mar 11, 2024  9:28 AM CMA Rocky Cipro C wrote: Wrong office

## 2024-03-15 ENCOUNTER — Encounter: Payer: Self-pay | Admitting: Internal Medicine

## 2024-03-15 ENCOUNTER — Ambulatory Visit (INDEPENDENT_AMBULATORY_CARE_PROVIDER_SITE_OTHER): Payer: BC Managed Care – PPO | Admitting: Internal Medicine

## 2024-03-15 ENCOUNTER — Ambulatory Visit: Payer: Self-pay | Admitting: Internal Medicine

## 2024-03-15 VITALS — BP 138/84 | HR 97 | Temp 97.6°F | Ht 69.0 in | Wt 156.0 lb

## 2024-03-15 DIAGNOSIS — Z0000 Encounter for general adult medical examination without abnormal findings: Secondary | ICD-10-CM

## 2024-03-15 DIAGNOSIS — K2100 Gastro-esophageal reflux disease with esophagitis, without bleeding: Secondary | ICD-10-CM | POA: Insufficient documentation

## 2024-03-15 DIAGNOSIS — R7309 Other abnormal glucose: Secondary | ICD-10-CM

## 2024-03-15 DIAGNOSIS — E0500 Thyrotoxicosis with diffuse goiter without thyrotoxic crisis or storm: Secondary | ICD-10-CM

## 2024-03-15 LAB — CBC
HCT: 39.2 % (ref 36.0–46.0)
Hemoglobin: 12.9 g/dL (ref 12.0–15.0)
MCHC: 32.9 g/dL (ref 30.0–36.0)
MCV: 88.3 fl (ref 78.0–100.0)
Platelets: 208 10*3/uL (ref 150.0–400.0)
RBC: 4.43 Mil/uL (ref 3.87–5.11)
RDW: 14.4 % (ref 11.5–15.5)
WBC: 4.6 10*3/uL (ref 4.0–10.5)

## 2024-03-15 LAB — COMPREHENSIVE METABOLIC PANEL WITH GFR
ALT: 8 U/L (ref 0–35)
AST: 17 U/L (ref 0–37)
Albumin: 4.4 g/dL (ref 3.5–5.2)
Alkaline Phosphatase: 117 U/L (ref 39–117)
BUN: 19 mg/dL (ref 6–23)
CO2: 29 meq/L (ref 19–32)
Calcium: 9.1 mg/dL (ref 8.4–10.5)
Chloride: 105 meq/L (ref 96–112)
Creatinine, Ser: 0.81 mg/dL (ref 0.40–1.20)
GFR: 78.84 mL/min (ref >60.00)
Glucose, Bld: 106 mg/dL — ABNORMAL HIGH (ref 70–99)
Potassium: 4.4 meq/L (ref 3.5–5.1)
Sodium: 139 meq/L (ref 135–145)
Total Bilirubin: 0.6 mg/dL (ref 0.2–1.2)
Total Protein: 7 g/dL (ref 6.0–8.3)

## 2024-03-15 LAB — T4, FREE: Free T4: 0.81 ng/dL (ref 0.60–1.60)

## 2024-03-15 LAB — HEMOGLOBIN A1C: Hgb A1c MFr Bld: 5.9 % (ref 4.6–6.5)

## 2024-03-15 LAB — TSH: TSH: 2.6 u[IU]/mL (ref 0.35–5.50)

## 2024-03-15 NOTE — Assessment & Plan Note (Signed)
 Seems like she may be in remission Weaning the methimazole --will check labs again

## 2024-03-15 NOTE — Assessment & Plan Note (Signed)
 Discussed no late eating, elevated HOB Just restarted the pantoprazole  ----can even try bid for now If not better in the next week or so, will add sucralfate (she had this before) She has a GI appointment pending

## 2024-03-15 NOTE — Progress Notes (Signed)
 Would Dr Avelina be willing to take on a patient of Dr Jimmy?    1 year for PE---requests Dr Avelina

## 2024-03-15 NOTE — Assessment & Plan Note (Signed)
 Healthy Has been working out lately Colon due 3/26 Mammogram every other year Pap per gyn

## 2024-03-15 NOTE — Progress Notes (Signed)
 Subjective:    Patient ID: Jenny Rangel, female    DOB: 1961/07/27, 63 y.o.   MRN: [id number]  HPI Here for physical  Having more trouble with acid reflux Worst in years Feels lump in throat--though some better since last week Having a lot of gas pains and bloated feeling--radiates to back Feels uncomfortable sitting Taking protonix  just for the past 3 days---was trying to manage it with diet Has tried cutting down on carbonated drinks Trying aloe supplement--?increasing gas (so stopped) No sig dysphagia  Has tried weaning methimazole  Taking it 5 days a week now No symptoms  Current Outpatient Medications on File Prior to Visit  Medication Sig Dispense Refill   Cholecalciferol (VITAMIN D3 PO) Take by mouth.     methimazole  (TAPAZOLE ) 5 MG tablet Take 1 tablet (5 mg total) by mouth 3 (three) times daily. (Patient taking differently: Take 5 mg by mouth daily. Taking 5 days a week now.) 90 tablet 3   pantoprazole  (PROTONIX ) 40 MG tablet Take 1 tablet (40 mg total) by mouth daily. 90 tablet 3   No current facility-administered medications on file prior to visit.    No Known Allergies  Past Medical History:  Diagnosis Date   GERD (gastroesophageal reflux disease)    Nephrolithiasis 1/14   non obstructing--on CT by Dr Gaylyn    Past Surgical History:  Procedure Laterality Date   ESOPHAGOGASTRODUODENOSCOPY  08/99   normal   VAGINAL DELIVERY     x4    Family History  Problem Relation Age of Onset   Depression Sister        committed suicide   Diabetes Neg Hx    Heart disease Neg Hx    Hypertension Neg Hx    Ataxia Neg Hx    Chorea Neg Hx    Dementia Neg Hx    Mental retardation Neg Hx    Migraines Neg Hx    Multiple sclerosis Neg Hx    Neurofibromatosis Neg Hx    Neuropathy Neg Hx    Parkinsonism Neg Hx    Seizures Neg Hx    Stroke Neg Hx    Breast cancer Neg Hx     Social History   Socioeconomic History   Marital status: Married    Spouse name: Not  on file   Number of children: 4   Years of education: Not on file   Highest education level: Bachelor's degree (e.g., BA, AB, BS)  Occupational History   Occupation: Runner, broadcasting/film/video, elementary school  Tobacco Use   Smoking status: Never   Smokeless tobacco: Never  Vaping Use   Vaping status: Never Used  Substance and Sexual Activity   Alcohol use: No    Comment: occ   Drug use: No   Sexual activity: Not on file  Other Topics Concern   Not on file  Social History Narrative   Not on file   Social Drivers of Health   Financial Resource Strain: Low Risk  (03/14/2024)   Overall Financial Resource Strain (CARDIA)    Difficulty of Paying Living Expenses: Not hard at all  Food Insecurity: No Food Insecurity (03/14/2024)   Hunger Vital Sign    Worried About Running Out of Food in the Last Year: Never true    Ran Out of Food in the Last Year: Never true  Transportation Needs: No Transportation Needs (03/14/2024)   PRAPARE - Administrator, Civil Service (Medical): No    Lack of Transportation (Non-Medical): No  Physical Activity: Sufficiently Active (03/14/2024)   Exercise Vital Sign    Days of Exercise per Week: 5 days    Minutes of Exercise per Session: 30 min  Stress: No Stress Concern Present (03/14/2024)   [employer] of Occupational Health - Occupational Stress Questionnaire    Feeling of Stress: Not at all  Social Connections: Socially Integrated (03/14/2024)   Social Connection and Isolation Panel    Frequency of Communication with Friends and Family: More than three times a week    Frequency of Social Gatherings with Friends and Family: Twice a week    Attends Religious Services: More than 4 times per year    Active Member of [employer] or Organizations: Yes    Attends Engineer, structural: More than 4 times per year    Marital Status: Married  Catering manager Violence: Not on file   Review of Systems  Constitutional:  Negative for fatigue and unexpected  weight change.       Wears seat belt Now running 5 days a week  HENT:  Negative for dental problem, hearing loss and tinnitus.        Keeps up with dentist  Eyes:  Negative for visual disturbance.       No diplopia or unilateral vision  Respiratory:  Negative for cough, chest tightness and shortness of breath.   Cardiovascular:  Negative for chest pain, palpitations and leg swelling.  Gastrointestinal:  Negative for blood in stool and constipation.  Endocrine: Negative for polydipsia and polyuria.  Genitourinary:  Negative for dyspareunia, dysuria and hematuria.  Musculoskeletal:  Negative for arthralgias, back pain and joint swelling.  Skin:  Negative for rash.       Does have appt with derm soon  Allergic/Immunologic: Negative for environmental allergies and immunocompromised state.  Neurological:  Negative for dizziness, syncope, light-headedness and headaches.  Hematological:  Negative for adenopathy. Does not bruise/bleed easily.  Psychiatric/Behavioral:  Negative for dysphoric mood and sleep disturbance. The patient is not nervous/anxious.        Objective:   Physical Exam Constitutional:      Appearance: Normal appearance.  HENT:     Mouth/Throat:     Pharynx: No oropharyngeal exudate or posterior oropharyngeal erythema.   Eyes:     Conjunctiva/sclera: Conjunctivae normal.     Pupils: Pupils are equal, round, and reactive to light.    Cardiovascular:     Rate and Rhythm: Normal rate and regular rhythm.     Pulses: Normal pulses.     Heart sounds: No murmur heard.    No gallop.  Pulmonary:     Effort: Pulmonary effort is normal.     Breath sounds: Normal breath sounds. No wheezing or rales.  Abdominal:     Palpations: Abdomen is soft.     Tenderness: There is no abdominal tenderness.   Musculoskeletal:     Cervical back: Neck supple.     Right lower leg: No edema.     Left lower leg: No edema.  Lymphadenopathy:     Cervical: No cervical adenopathy.    Skin:    Findings: No rash.   Neurological:     General: No focal deficit present.     Mental Status: She is alert and oriented to person, place, and time.   Psychiatric:        Mood and Affect: Mood normal.        Behavior: Behavior normal.  Assessment & Plan:

## 2024-03-23 ENCOUNTER — Encounter: Payer: Self-pay | Admitting: Internal Medicine

## 2024-03-23 DIAGNOSIS — R1310 Dysphagia, unspecified: Secondary | ICD-10-CM

## 2024-04-01 ENCOUNTER — Encounter: Payer: Self-pay | Admitting: Nurse Practitioner

## 2024-04-08 NOTE — Telephone Encounter (Signed)
 Copied from CRM [id number]. Topic: Clinical - Medical Advice >> Apr 08, 2024  8:35 AM Franky GRADE wrote: Reason for CRM: Patient is calling to advise Dr.Letvak that she is still experiencing acid reflux and some discomfort in the upper stomach. Tried to schedule an appointment with GI; however, they won't be able to get the patient in until September.

## 2024-04-09 MED ORDER — SUCRALFATE 1 G PO TABS: 1.0000 g | ORAL_TABLET | Freq: Three times a day (TID) | ORAL | 2 refills | Status: AC

## 2024-04-09 NOTE — Addendum Note (Signed)
 Addended by: JIMMY ADE I on: 04/09/2024 10:58 AM   Modules accepted: Orders

## 2024-05-26 ENCOUNTER — Ambulatory Visit: Admitting: Nurse Practitioner

## 2024-05-26 ENCOUNTER — Encounter: Payer: Self-pay | Admitting: Nurse Practitioner

## 2024-05-26 ENCOUNTER — Ambulatory Visit: Admitting: Physician Assistant

## 2024-05-26 ENCOUNTER — Other Ambulatory Visit

## 2024-05-26 VITALS — BP 138/64 | HR 93 | Ht 69.0 in | Wt 157.2 lb

## 2024-05-26 DIAGNOSIS — K219 Gastro-esophageal reflux disease without esophagitis: Secondary | ICD-10-CM

## 2024-05-26 DIAGNOSIS — Z860102 Personal history of hyperplastic colon polyps: Secondary | ICD-10-CM

## 2024-05-26 DIAGNOSIS — K5900 Constipation, unspecified: Secondary | ICD-10-CM | POA: Diagnosis not present

## 2024-05-26 MED ORDER — PANTOPRAZOLE SODIUM 40 MG PO TBEC: 40.0000 mg | DELAYED_RELEASE_TABLET | Freq: Two times a day (BID) | ORAL | 2 refills | Status: AC

## 2024-05-26 NOTE — Progress Notes (Signed)
 05/26/2024 KIRA HARTL [id number] 03-17-1961   CHIEF COMPLAINT: GERD, stomach discomfort  HISTORY OF PRESENT ILLNESS: Advika Mclelland. Werntz is a 63 year old female with a past medical history of kidney stones, hyperthyroidism on Methimazole , GERD and colon polyps.  She presents to our office today as referred by Dr. Richard Letvak for further evaluation regarding GERD and stomach discomfort. She endorses having a lump sensation and irritation in her throat which started around the middle of June 2025. She also noted having epigastric discomfort which occurs when sitting and sometimes eating worsens this discomfort. No specific food triggers. No nausea or vomiting. She denies having classic heartburn. She also notes having intermittent postnasal drainage. She was previously prescribed Pantoprazole  40 mg daily which she self reduced to every other day about 3 weeks ago. She endorses undergoing 2 or 3 upper endoscopies in the past, last EGD was possibly in 2013, results unclear. She is concerned about possibly having a parasitic infection triggered by GERD. She has constipation with variable abdominal gas, passes small balls of stool most days. No bloody or black stools. She used MiraLAX in the past which resulted in passing a lot of gas. She tolerated Citrucel when used in the past but is not taking it at this time. She underwent a colonoscopy at [facility] 11/2014 which showed a tortuous colon diverticulosis in the sigmoid and distal descending colon and a less than 1 mm hyperplastic polyp was removed from the rectum.  No known family history of colorectal cancer.  No weight loss.     Latest Ref Rng & Units 03/15/2024    9:14 AM 10/22/2023    4:15 PM 06/19/2023    9:37 AM  CBC  WBC 4.0 - 10.5 K/uL 4.6  5.8  5.1   Hemoglobin 12.0 - 15.0 g/dL 87.0  87.0  87.2   Hematocrit 36.0 - 46.0 % 39.2  38.9  39.4   Platelets 150.0 - 400.0 K/uL 208.0  233.0  216.0        Latest Ref Rng & Units 03/15/2024    9:14 AM  10/22/2023    4:15 PM 06/19/2023    9:37 AM  CMP  Glucose 70 - 99 mg/dL 893  84  887   BUN 6 - 23 mg/dL 19  21  20    Creatinine 0.40 - 1.20 mg/dL 9.18  9.04  9.35   Sodium 135 - 145 mEq/L 139  140  141   Potassium 3.5 - 5.1 mEq/L 4.4  4.8  4.9   Chloride 96 - 112 mEq/L 105  105  108   CO2 19 - 32 mEq/L 29  26  26    Calcium 8.4 - 10.5 mg/dL 9.1  9.1  9.4   Total Protein 6.0 - 8.3 g/dL 7.0  7.1  6.7   Total Bilirubin 0.2 - 1.2 mg/dL 0.6  0.4  0.5   Alkaline Phos 39 - 117 U/L 117  167  92   AST 0 - 37 U/L 17  17  16    ALT 0 - 35 U/L 8  7  12      TSH 2.6   Colonoscopy 12/11/2014 by Dr. Gaylyn at [facility]: Tortuous colon One less than 1mm hyperplastic polyp in the rectum, resected and retrieved  Diverticulosis in the sigmoid colon an in the distal descending colon  The examination was otherwise normal   Past Medical History:  Diagnosis Date   GERD (gastroesophageal reflux disease)    Nephrolithiasis 1/14  non obstructing--on CT by Dr Gaylyn   Past Surgical History:  Procedure Laterality Date   ESOPHAGOGASTRODUODENOSCOPY  08/99   normal   VAGINAL DELIVERY     x4   Social History: She is married. She has 2 sons and 2 daughters. She is a Runner, broadcasting/film/video.  Non-smoker.  No alcohol use.  No drug use.  Family History: No known family history of esophageal, gastric or colon cancer.  No Known Allergies   Outpatient Encounter Medications as of 05/26/2024  Medication Sig   Cholecalciferol (VITAMIN D3 PO) Take by mouth.   methimazole  (TAPAZOLE ) 5 MG tablet Take 1 tablet (5 mg total) by mouth 3 (three) times daily. (Patient taking differently: Take 5 mg by mouth daily. Taking 5 days a week now.)   pantoprazole  (PROTONIX ) 40 MG tablet Take 1 tablet (40 mg total) by mouth daily.   sucralfate  (CARAFATE ) 1 g tablet Take 1 tablet (1 g total) by mouth 3 (three) times daily before meals.   No facility-administered encounter medications on file as of 05/26/2024.   REVIEW OF SYSTEMS:  Gen: Denies  fever, sweats or chills. No weight loss.  CV: Denies chest pain, palpitations or edema. Resp: Denies cough, shortness of breath of hemoptysis.  GI:See HPI. GU: Denies urinary burning, blood in urine, increased urinary frequency or incontinence. MS: Denies joint pain, muscles aches or weakness. Derm: Denies rash, itchiness, skin lesions or unhealing ulcers. Psych: Denies depression, anxiety, memory loss or confusion. Heme: Denies bruising, easy bleeding. Neuro:  Denies headaches, dizziness or paresthesias. Endo:  Denies any problems with DM, thyroid  or adrenal function.  PHYSICAL EXAM: BP 138/64 (BP Location: Left Arm, Patient Position: Sitting, Cuff Size: Normal)   Pulse 93   Ht 5' 9 (1.753 m)   Wt 157 lb 4 oz (71.3 kg)   BMI 23.22 kg/m   Wt Readings from Last 3 Encounters:  05/26/24 157 lb 4 oz (71.3 kg)  03/15/24 156 lb (70.8 kg)  10/22/23 148 lb (67.1 kg)    General: 63 year old female in no acute distress. Head: Normocephalic and atraumatic. Eyes:  Sclerae non-icteric, conjunctive pink. Ears: Normal auditory acuity. Mouth: Dentition intact. No ulcers or lesions.  Neck: Supple, no lymphadenopathy or thyromegaly.  Lungs: Clear bilaterally to auscultation without wheezes, crackles or rhonchi. Heart: Regular rate and rhythm. No murmur, rub or gallop appreciated.  Abdomen: Soft, nontender, nondistended. No masses. No hepatosplenomegaly. Normoactive bowel sounds x 4 quadrants.  Rectal: Deferred. Musculoskeletal: Symmetrical with no gross deformities. Skin: Warm and dry. No rash or lesions on visible extremities. Extremities: No edema. Neurological: Alert oriented x 4, no focal deficits.  Psychological: Alert and cooperative. Normal mood and affect.  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 twice daily to be taken 30 minutes before breakfast and dinner - EGD benefits and risks discussed  including risk with sedation, risk of bleeding, perforation and infection - Patient to provide symptom update in 2 weeks, if symptoms persist we will schedule an EGD at that time - Recommend ENT consult to include laryngoscopy  Epigastric pain, occurs intermittently when sitting and sometimes worsens after eating.  No N/V. Negative abdominal exam at this time. - See plan above - CTAP if abdominal pain recurs/worsens  Constipation with variable abdominal gas - Citrucel daily - Prune lax 1-2 tabs every third night - Dietary fiber as tolerated - 64 ounces of water daily - Patient to provide symptom update in 2 weeks  History of a rectal hyperplastic polyp per colonoscopy 11/2014. - Next colon polyp surveillance colonoscopy due 11/2024    CC:  Jimmy Charlie FERNS, MD

## 2024-05-26 NOTE — Patient Instructions (Addendum)
 Citrucel daily.  Prune Lax 1-2 tablets every 3rd night   Contact Jenny Nyle Sharps, NP,  in 2 weeks with an update.   We have sent the following medications to your pharmacy for you to pick up at your convenience: Pantoprazole  increased to twice a day.  Your provider has requested that you go to the basement level for lab work before leaving today. Press B on the elevator. The lab is located at the first door on the left as you exit the elevator.  _______________________________________________________  If your blood pressure at your visit was 140/90 or greater, please contact your primary care physician to follow up on this.  _______________________________________________________  If you are age 61 or older, your body mass index should be between 23-30. Your Body mass index is 23.22 kg/m. If this is out of the aforementioned range listed, please consider follow up with your Primary Care Provider.  If you are age 61 or younger, your body mass index should be between 19-25. Your Body mass index is 23.22 kg/m. If this is out of the aformentioned range listed, please consider follow up with your Primary Care Provider.   ________________________________________________________  The  GI providers would like to encourage you to use MYCHART to communicate with providers for non-urgent requests or questions.  Due to long hold times on the telephone, sending your provider a message by [facility] may be a faster and more efficient way to get a response.  Please allow 48 business hours for a response.  Please remember that this is for non-urgent requests.  _______________________________________________________  [facility] is using a team-based approach to care.  Your team is made up of your doctor and two to three APPS. Our APPS (Nurse Practitioners and Physician Assistants) work with your physician to ensure care continuity for you. They are fully qualified to address your  health concerns and develop a treatment plan. They communicate directly with your gastroenterologist to care for you. Seeing the Advanced Practice Practitioners on your physician's team can help you by facilitating care more promptly, often allowing for earlier appointments, access to diagnostic testing, procedures, and other specialty referrals.

## 2024-05-27 LAB — IGA: Immunoglobulin A: 308 mg/dL (ref 47–310)

## 2024-05-27 LAB — TISSUE TRANSGLUTAMINASE, IGA: (tTG) Ab, IgA: <1 U/mL

## 2024-05-27 NOTE — Progress Notes (Signed)
 Addendum: Reviewed and agree with assessment and management plan. Agree with your recommendations and would not test for O&P at this time, but await treatment response Trevis Eden, Gordy HERO, MD

## 2024-05-29 ENCOUNTER — Ambulatory Visit: Payer: Self-pay | Admitting: Nurse Practitioner

## 2024-05-29 DIAGNOSIS — K5900 Constipation, unspecified: Secondary | ICD-10-CM

## 2024-05-29 DIAGNOSIS — R194 Change in bowel habit: Secondary | ICD-10-CM

## 2024-05-29 DIAGNOSIS — K219 Gastro-esophageal reflux disease without esophagitis: Secondary | ICD-10-CM

## 2024-06-14 NOTE — Telephone Encounter (Signed)
 Inbound call from patient stating that she would like to know if she can schedule for an EGD and colonoscopy. Patient is requesting a call back.Please advise.

## 2024-06-16 NOTE — Telephone Encounter (Signed)
 Jenny Rangel, ok to schedule patient for an EGD and colonoscopy in [facility] with Dr. Albertus.   She has a history of a hyperplastic polyp in 2016 and is not due for a colon polyp surveillance colonoscopy until 11/2024.  Due to change of bowel pattern, worsening constipation, ok to schedule a diagnostic colonoscopy at time of EGD.   Patient to continue Pantoprazole  40mg  bid.   If patient is wanting to try Linzess  145 mcg one tab po once daily to be taken 30 minutes before breakfast for constipation ok to send in RX for # 30, 1 refill. THX.

## 2024-06-16 NOTE — Telephone Encounter (Signed)
 Dottie, in addition to the prior message. Pls inform patient that I did not find any research that identified any correlation between GERD and risk of parasitic infection. THX.

## 2024-06-17 MED ORDER — NA SULFATE-K SULFATE-MG SULF 17.5-3.13-1.6 GM/177ML PO SOLN: ORAL | 0 refills | Status: AC

## 2024-06-17 MED ORDER — LINACLOTIDE 145 MCG PO CAPS: 145.0000 ug | ORAL_CAPSULE | Freq: Every day | ORAL | 1 refills | Status: AC

## 2024-06-17 NOTE — Telephone Encounter (Signed)
 I have spoken to patient to advise that Colleen has given clearance for her to move forward with both endoscopy and colonoscopy due to her recent change in bowels/constipation and GERD. Patient has  scheduled endoscopy and colonoscopy for 07/18/24 and been advised of time/date/location for upcoming procedure and has been given along with generalized verbal prep instructions. Discussed that a care partner 18 years or older should bring her, stay for the procedure and drive home due to sedation. Written instructions have been made available to the patient for additional review via mychart.  Patient is also advised that Elida has found no research correlating GERD with parasitic infection and therefore no parasitic testing is needed at this time. She verbalizes understanding.

## 2024-07-11 ENCOUNTER — Encounter: Payer: Self-pay | Admitting: Internal Medicine

## 2024-07-18 ENCOUNTER — Encounter: Admitting: Internal Medicine

## 2024-07-19 ENCOUNTER — Ambulatory Visit (INDEPENDENT_AMBULATORY_CARE_PROVIDER_SITE_OTHER)

## 2024-07-19 DIAGNOSIS — K5730 Diverticulosis of large intestine without perforation or abscess without bleeding: Secondary | ICD-10-CM | POA: Diagnosis not present

## 2024-07-19 DIAGNOSIS — K640 First degree hemorrhoids: Secondary | ICD-10-CM | POA: Diagnosis not present

## 2024-07-19 DIAGNOSIS — K2950 Unspecified chronic gastritis without bleeding: Secondary | ICD-10-CM | POA: Diagnosis not present

## 2024-07-19 DIAGNOSIS — Z1211 Encounter for screening for malignant neoplasm of colon: Secondary | ICD-10-CM | POA: Diagnosis present
